# Patient Record
Sex: Male | Born: 1940 | Race: White | Hispanic: No | Marital: Married | Smoking: Former smoker
Health system: Southern US, Community
[De-identification: ages and names within clinical notes are randomized; demographics above are authoritative.]

## PROBLEM LIST (undated history)

## (undated) DIAGNOSIS — I519 Heart disease, unspecified: Secondary | ICD-10-CM

## (undated) DIAGNOSIS — Z9889 Other specified postprocedural states: Secondary | ICD-10-CM

## (undated) DIAGNOSIS — E785 Hyperlipidemia, unspecified: Secondary | ICD-10-CM

## (undated) DIAGNOSIS — I4891 Unspecified atrial fibrillation: Secondary | ICD-10-CM

## (undated) DIAGNOSIS — Z9289 Personal history of other medical treatment: Secondary | ICD-10-CM

## (undated) DIAGNOSIS — I509 Heart failure, unspecified: Secondary | ICD-10-CM

## (undated) DIAGNOSIS — I219 Acute myocardial infarction, unspecified: Secondary | ICD-10-CM

## (undated) DIAGNOSIS — K635 Polyp of colon: Secondary | ICD-10-CM

## (undated) DIAGNOSIS — Z8679 Personal history of other diseases of the circulatory system: Secondary | ICD-10-CM

## (undated) DIAGNOSIS — I2510 Atherosclerotic heart disease of native coronary artery without angina pectoris: Secondary | ICD-10-CM

## (undated) DIAGNOSIS — I10 Essential (primary) hypertension: Secondary | ICD-10-CM

## (undated) DIAGNOSIS — K639 Disease of intestine, unspecified: Secondary | ICD-10-CM

## (undated) DIAGNOSIS — I499 Cardiac arrhythmia, unspecified: Secondary | ICD-10-CM

## (undated) HISTORY — DX: Cardiac arrhythmia, unspecified: I49.9

## (undated) HISTORY — PX: COLON SURGERY: SHX602

## (undated) HISTORY — DX: Essential (primary) hypertension: I10

## (undated) HISTORY — DX: Polyp of colon: K63.5

## (undated) HISTORY — DX: Acute myocardial infarction, unspecified: I21.9

## (undated) HISTORY — DX: Other specified postprocedural states: Z98.890

## (undated) HISTORY — DX: Disease of intestine, unspecified: K63.9

## (undated) HISTORY — DX: Atherosclerotic heart disease of native coronary artery without angina pectoris: I25.10

## (undated) HISTORY — DX: Personal history of other medical treatment: Z92.89

## (undated) HISTORY — DX: Personal history of other diseases of the circulatory system: Z86.79

## (undated) HISTORY — DX: Heart failure, unspecified: I50.9

## (undated) HISTORY — PX: CARDIAC CATHETERIZATION: SHX172

## (undated) HISTORY — DX: Unspecified atrial fibrillation: I48.91

## (undated) HISTORY — DX: Hyperlipidemia, unspecified: E78.5

## (undated) HISTORY — DX: Heart disease, unspecified: I51.9

---

## 1963-12-21 DIAGNOSIS — Z8679 Personal history of other diseases of the circulatory system: Secondary | ICD-10-CM

## 1963-12-21 HISTORY — DX: Personal history of other diseases of the circulatory system: Z86.79

## 1985-12-20 DIAGNOSIS — I519 Heart disease, unspecified: Secondary | ICD-10-CM

## 1985-12-20 DIAGNOSIS — I219 Acute myocardial infarction, unspecified: Secondary | ICD-10-CM

## 1985-12-20 HISTORY — DX: Acute myocardial infarction, unspecified: I21.9

## 1985-12-20 HISTORY — DX: Heart disease, unspecified: I51.9

## 1997-12-20 DIAGNOSIS — K639 Disease of intestine, unspecified: Secondary | ICD-10-CM

## 1997-12-20 HISTORY — DX: Disease of intestine, unspecified: K63.9

## 1997-12-20 HISTORY — PX: COLON RESECTION: SHX5231

## 2005-12-20 DIAGNOSIS — I10 Essential (primary) hypertension: Secondary | ICD-10-CM

## 2005-12-20 HISTORY — DX: Essential (primary) hypertension: I10

## 2006-09-12 ENCOUNTER — Ambulatory Visit: Payer: Self-pay | Admitting: General Surgery

## 2013-03-23 ENCOUNTER — Encounter: Payer: Self-pay | Admitting: *Deleted

## 2013-03-23 DIAGNOSIS — I519 Heart disease, unspecified: Secondary | ICD-10-CM | POA: Insufficient documentation

## 2013-03-23 DIAGNOSIS — K639 Disease of intestine, unspecified: Secondary | ICD-10-CM | POA: Insufficient documentation

## 2013-04-02 ENCOUNTER — Ambulatory Visit: Payer: Self-pay | Admitting: Physical Medicine and Rehabilitation

## 2013-04-03 ENCOUNTER — Encounter: Payer: Self-pay | Admitting: General Surgery

## 2013-04-03 ENCOUNTER — Ambulatory Visit (INDEPENDENT_AMBULATORY_CARE_PROVIDER_SITE_OTHER): Payer: Medicare Other | Admitting: General Surgery

## 2013-04-03 VITALS — BP 120/70 | HR 80 | Resp 16 | Ht 68.0 in | Wt 221.0 lb

## 2013-04-03 DIAGNOSIS — Z86010 Personal history of colonic polyps: Secondary | ICD-10-CM

## 2013-04-03 MED ORDER — POLYETHYLENE GLYCOL 3350 17 GM/SCOOP PO POWD: ORAL | Status: DC

## 2013-04-03 NOTE — Patient Instructions (Addendum)
Patient to have screening colonoscopy.   Patient has been scheduled for a colonoscopy on 05-30-13 at [facility]. This patient has been asked to discontinue warfarin five days prior to procedure.

## 2013-04-03 NOTE — Progress Notes (Signed)
Patient ID: John [name], male   DOB: January 31, 1941, 72 y.o.   MRN: [id number]  Chief Complaint  Patient presents with  . Other    screening colonoscopy    HPI John [name] is a 72 y.o. male who presents today for a screening colonoscopy evaluation. Most recent colonoscopy was done October 2007 were polyps were found. Patient has a history of colon resection which was done in 2000.  The patient states no complaints at this time.  HPI  Past Medical History  Diagnosis Date  . Hypertension 2007  . Bowel trouble 1999  . Cardiac disease 1987  . History of angina 1965  . Heart attack 1987  . A-fib   . Colon polyp   . Hyperlipidemia     Past Surgical History  Procedure Laterality Date  . Colon resection  1999    Family History  Problem Relation Age of Onset  . Other Father     brain tumor    Social History History  Substance Use Topics  . Smoking status: Former Smoker -- 30 years  . Smokeless tobacco: Never Used  . Alcohol Use: 7.0 oz/week    14 drink(s) per week    Allergies  Allergen Reactions  . Procan Sr (Procainamide) Rash    Current Outpatient Prescriptions  Medication Sig Dispense Refill  . digoxin (LANOXIN) 0.125 MG tablet Take 0.125 mg by mouth daily.      Marland Kitchen doxycycline (VIBRAMYCIN) 100 MG capsule Take 100 mg by mouth daily.      . hydrochlorothiazide (HYDRODIURIL) 25 MG tablet Take 25 mg by mouth daily.      . Hydrocortisone Probutate (PANDEL) 0.1 % CREA Apply 1 application topically daily as needed.      Marland Kitchen lisinopril (PRINIVIL,ZESTRIL) 40 MG tablet Take 40 mg by mouth 2 (two) times daily.      Marland Kitchen loratadine (CLARITIN) 10 MG tablet Take 10 mg by mouth daily.      . niacin 500 MG tablet Take 1,000 mg by mouth daily.      . nitroGLYCERIN (NITROSTAT) 0.4 MG SL tablet Place 0.4 mg under the tongue every 5 (five) minutes as needed for chest pain.      . polyethylene glycol powder (GLYCOLAX/MIRALAX) powder 255 grams one bottle for colonoscopy prep  255 g  0  .  simvastatin (ZOCOR) 40 MG tablet Take 40 mg by mouth every evening.      . warfarin (COUMADIN) 1 MG tablet Take as directed up to 3 per day.      . warfarin (COUMADIN) 5 MG tablet Use as directed. Take up to 3 per day.       No current facility-administered medications for this visit.    Review of Systems Review of Systems  Constitutional: Negative.   Respiratory: Negative.   Cardiovascular: Negative.   Gastrointestinal: Negative.     Blood pressure 120/70, pulse 80, resp. rate 16, height 5\' 8"  (1.727 m), weight 221 lb (100.245 kg).  Physical Exam Physical Exam  Constitutional: He appears well-developed and well-nourished.  Cardiovascular: Normal rate, normal heart sounds and normal pulses.   No murmur heard. Pulmonary/Chest: Effort normal and breath sounds normal.    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 Colonoscopy in September 2007 showed a transverse colon tubular adenoma 8 mm in diameter. Polyps in the sigmoid and rectum were hyperplastic.  Comprehensive metabolic panel dated 02/21/2013 showed normal electrolytes, creatinine 0.9, normal liver function  studies, blood sugar 114.  CBC of the same date showed a hemoglobin of 13.3, MCV 8096, hematocrit 40, white blood cell count 6500 with normal differential, platelet count 184,000. Hemoglobin A1c 5.7.  Assessment    Past history of colonic polyps in 1998 and 2007. Atrial fibrillation requiring oral anticoagulation.    Plan    Colonoscopy plans were reviewed with the patient. She will be necessary for him to discontinue his Coumadin 5 days prior to the procedure. He will continue all his other cardiac medications. If he is presently not taking aspirin, he will be asked to make use of a 81 mg aspirin while he is not taking his Coumadin (aspirin was not listed as an active medication).    Patient has been scheduled for a colonoscopy on 05-30-13 at [facility]. This  patient has been asked to discontinue warfarin five days prior to procedure.    John [name] 04/04/2013, 3:24 PM

## 2013-04-04 DIAGNOSIS — Z860100 Personal history of colon polyps, unspecified: Secondary | ICD-10-CM | POA: Insufficient documentation

## 2013-05-18 ENCOUNTER — Telehealth: Payer: Self-pay | Admitting: *Deleted

## 2013-05-18 NOTE — Telephone Encounter (Signed)
Message copied by Nicholes Mango on Fri May 18, 2013 10:06 AM ------      Message from: Meacham, Merrily Pew      Created: Wed Apr 04, 2013  3:35 PM       When you contact the patient the week prior to his colonoscopy in June 2014, ask him to make use of a pediatric aspirin tablet when he stopped his Coumadin in preparation for the procedure. Thank you ------

## 2013-05-18 NOTE — Telephone Encounter (Signed)
Patient was notified as instructed. This patient also reports all medications are the same except he has started taking gabapentin. Medication will be added to list. He reports he has yet to pre-register but was instructed to do so no later than Friday, 05-25-13. Patient will call the office if he has further questions. We will proceed with colonoscopy that is scheduled for 05-30-13.

## 2013-05-28 ENCOUNTER — Other Ambulatory Visit: Payer: Self-pay | Admitting: General Surgery

## 2013-05-28 DIAGNOSIS — Z86010 Personal history of colonic polyps: Secondary | ICD-10-CM

## 2013-05-30 ENCOUNTER — Ambulatory Visit: Payer: Self-pay | Admitting: General Surgery

## 2013-05-30 ENCOUNTER — Encounter: Payer: Self-pay | Admitting: General Surgery

## 2013-05-30 DIAGNOSIS — D128 Benign neoplasm of rectum: Secondary | ICD-10-CM

## 2013-05-30 DIAGNOSIS — K635 Polyp of colon: Secondary | ICD-10-CM

## 2013-05-30 DIAGNOSIS — D129 Benign neoplasm of anus and anal canal: Secondary | ICD-10-CM

## 2013-05-30 HISTORY — PX: COLONOSCOPY: SHX174

## 2013-05-30 HISTORY — DX: Polyp of colon: K63.5

## 2013-06-01 ENCOUNTER — Encounter: Payer: Self-pay | Admitting: General Surgery

## 2013-06-01 NOTE — Progress Notes (Signed)
Quick Note:  Polyps removed were benign. One was a small tubular adenoma, remaining hyperplastic. F/U exam in five years.    Cc:: PCP ______

## 2013-06-04 ENCOUNTER — Telehealth: Payer: Self-pay | Admitting: *Deleted

## 2013-06-04 NOTE — Telephone Encounter (Signed)
Patient was notified and will  return in 5 years for colonoscopy.

## 2013-06-05 ENCOUNTER — Encounter: Payer: Self-pay | Admitting: General Surgery

## 2017-03-08 ENCOUNTER — Ambulatory Visit: Payer: Self-pay | Admitting: Dietician

## 2017-03-29 ENCOUNTER — Encounter: Payer: Medicare Other | Attending: Family Medicine | Admitting: Dietician

## 2017-03-29 ENCOUNTER — Encounter: Payer: Self-pay | Admitting: Dietician

## 2017-03-29 VITALS — Ht 68.0 in | Wt 232.3 lb

## 2017-03-29 DIAGNOSIS — E162 Hypoglycemia, unspecified: Secondary | ICD-10-CM | POA: Insufficient documentation

## 2017-03-29 DIAGNOSIS — E119 Type 2 diabetes mellitus without complications: Secondary | ICD-10-CM | POA: Diagnosis not present

## 2017-03-29 DIAGNOSIS — IMO0001 Reserved for inherently not codable concepts without codable children: Secondary | ICD-10-CM

## 2017-03-29 DIAGNOSIS — R739 Hyperglycemia, unspecified: Secondary | ICD-10-CM

## 2017-03-29 NOTE — Patient Instructions (Signed)
   Pay careful attention to portions of starchy foods. Keep to 1/4 of your plate, or fist-size or less.   Add larger portions of low-carb veggies (low Vitamin K) and/or fruits to meals to feel full.

## 2017-03-29 NOTE — Progress Notes (Signed)
Medical Nutrition Therapy: Visit start time: 0900  end time: 1000  Assessment:  Diagnosis: diabetes/ hyperglycemia Past medical history: MI at age 76; HTN, sciatica Psychosocial issues/ stress concerns: none Preferred learning method:  . Visual  Current weight: 232.3lbs with shoes  Height: 5'8" Medications, supplements: reconciled list in medical record; patient is on Warfarin therapy  Progress and evaluation: Patient reports increase in BGs in the past 2 years since he has been unable to do much exercise due to sciatica. Does check BGs at home sporadically, every 1-2 weeks, fasting, with results typically in 120s. He and his wife have been incorporating heart healthy eating habits for the past 30 years since his MI.    Physical activity: none due to back pain  Dietary Intake:  Usual eating pattern includes 3 meals and 1-2 snacks per day. Dining out frequency: 10 meals per week.  Breakfast: 8am oatmeal and cottage cheese with fruit, decaf coffee 1c.  Snack: none Lunch: often out-- chicken sandwich or cold cut sandwich, occasionally with chips, unsweet tea Snack: peanuts Supper: wife cooks-- has diabetic cookbook or heart assoc. Sometimes uses [place] recipes-- lasagna with lowfat cheese and whole wheat pasta (freezes portions); spaghetti-tries to control portions. Adds salad or green beans or peas, low vitamin K vegetables.  Snack: glass wine Beverages: water, coffee, unsweet tea, 1 wine   Nutrition Care Education: Topics covered: diabetes, weight control Basic nutrition: basic food groups, appropriate nutrient balance, appropriate meal and snack schedule, general nutrition guidelines    Weight control: benefits of weight control, behavioral changes for weight loss including portion control; provided guidance for appropriate portions of starches and meats and basic meal planning for 1600-1700kcal intake. Used plate method and food models to illustrate portions.  Diabetes: appropriate  meal and snack schedule, appropriate carb intake and balance, healthy carb choices  Nutritional Diagnosis:  [place]-2.2 Altered nutrition-related laboratory As related to hyperglycemia.  As evidenced by HbA1C 6.5%. [place]-3.3 Overweight/obesity As related to inactivity, excess calories.  As evidenced by BMI 35.3.  Intervention: Instruction as noted above.   Set goals with direction from patient.    Commended patient for efforts at heart healthy diet: he is making low fat choices, several whole grain choices, and is including vegetables regularly.      Education Materials given:  . General diet guidelines for Diabetes . Plate Planner . Recipes-- Diabetes Friendly recipes . Sample menus: Quick and Healthy meals . Goals/ instructions  Learner/ who was taught:  . Patient   Level of understanding: Marland Kitchen Verbalizes/ demonstrates competency  Demonstrated degree of understanding via:   Teach back Learning barriers: . None  Willingness to learn/ readiness for change: . Eager, change in progress  Monitoring and Evaluation:  Dietary intake, exercise, BG control, and body weight      follow up: 04/29/17

## 2017-04-29 ENCOUNTER — Ambulatory Visit: Payer: Medicare Other | Admitting: Dietician

## 2017-05-20 ENCOUNTER — Encounter: Payer: Medicare Other | Attending: Family Medicine | Admitting: Dietician

## 2017-05-20 VITALS — Ht 68.0 in | Wt 233.5 lb

## 2017-05-20 DIAGNOSIS — R739 Hyperglycemia, unspecified: Secondary | ICD-10-CM

## 2017-05-20 DIAGNOSIS — E162 Hypoglycemia, unspecified: Secondary | ICD-10-CM | POA: Diagnosis present

## 2017-05-20 DIAGNOSIS — E119 Type 2 diabetes mellitus without complications: Secondary | ICD-10-CM | POA: Diagnosis present

## 2017-05-20 DIAGNOSIS — IMO0001 Reserved for inherently not codable concepts without codable children: Secondary | ICD-10-CM

## 2017-05-20 NOTE — Progress Notes (Signed)
Medical Nutrition Therapy: Visit start time: 0900  end time: 0920 Assessment:  Diagnosis: diabetes, obesity Medical history changes: no changes, will be receiving cortisone injections for back pain starting 06/09/17 Psychosocial issues/ stress concerns: none  Current weight: 233.1lbs Height: 5'8" Medications, supplement changes: slight increase in warfarin, but patient cannot recall exact days.   Progress and evaluation: Weight has essentially been stable, within 1lb. Since 03/29/17. Back pain has continued, so patient has been unable to increase exercise. He reports eating less at lunch most days.    Physical activity: low due to back pain  Dietary Intake:  Usual eating pattern includes 3 meals and 1-2 snacks per day. Dining out frequency: 10 meals per week.  Breakfast: oatmeal and fruit with cottage cheese, coffee Snack: none Lunch: usually out, chicken or cold cut sandwich; sometimes at [employer], now eating 1/2 plate Snack: peanuts occasionally Supper: recipes from heart assoc or diabetes cookbook; low Vitamin K vegetables Snack: glass wine Beverages: water-- increased from previous visit, coffee, unsweet tea  Nutrition Care Education: Topics covered: diabetes, weight management    Weight control: potential help in keeping food diary and/or measuring food portions, reviewed role of exercise. Diabetes: effects of pain and cortisone medication on blood sugars. Advised additional testing after receiving injection, and notifying MD if BGs increase significantly.   Nutritional Diagnosis:  [place]-2.2 Altered nutrition-related laboratory As related to hyperglycemia.  As evidenced by HbA1C 6.5%. [place]-3.3 Overweight/obesity As related to inactivity, excess calories.  As evidenced by BMI 35.3, patient report.  Intervention: Discussion as noted above.   Patient's goal is to continue with his current eating pattern, and will increase activity once he feels pain relief from cortisone  injections.     He declined further follow-up at this time.   Education Materials given:  Marland Kitchen Goals/ instructions  Learner/ who was taught:  . Patient   Level of understanding: Marland Kitchen Verbalizes/ demonstrates competency  Demonstrated degree of understanding via:   Teach back Learning barriers: . None  Willingness to learn/ readiness for change: . Eager, change in progress  Monitoring and Evaluation:  Dietary intake, exercise, BG control, and body weight      follow up: prn

## 2017-05-20 NOTE — Patient Instructions (Signed)
   Continue with current healthy eating pattern.   Test blood sugar daily for several days after cortisone injections. If blood sugars increase into 200s, let your doctor know.   Can consider keeping a food diary, and/or measuring some food portions to build greater awareness of actual food intake.

## 2018-03-31 ENCOUNTER — Encounter: Payer: Self-pay | Admitting: *Deleted

## 2018-06-06 ENCOUNTER — Ambulatory Visit (INDEPENDENT_AMBULATORY_CARE_PROVIDER_SITE_OTHER): Payer: Medicare Other | Admitting: General Surgery

## 2018-06-06 ENCOUNTER — Encounter: Payer: Self-pay | Admitting: General Surgery

## 2018-06-06 VITALS — BP 148/72 | HR 59 | Resp 14 | Ht 68.0 in | Wt 230.0 lb

## 2018-06-06 DIAGNOSIS — Z86010 Personal history of colonic polyps: Secondary | ICD-10-CM

## 2018-06-06 NOTE — Progress Notes (Signed)
Patient ID: John [name], male   DOB: Oct 18, 1941, 77 y.o.   MRN: [id number]  Chief Complaint  Patient presents with  . Colon Polyps    HPI John [name] is a 77 y.o. male.  Who presents for a colonoscopy discussion. The last colonoscopy was completed on 05-30-13. Denies any gastrointestinal issues. Bowels move regular and no bleeding noted.  Coumadin dose is 6 mg two days a week and the other days is 7 mg. He works part time as an Chief Financial Officer HPI  Past Medical History:  Diagnosis Date  . A-fib ([place])   . Bowel trouble 1999  . Cardiac disease 1987  . 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 Heart attack ([place]) 1987  . History of angina 1965  . History of cardioversion   . Hyperlipidemia   . Hypertension 2007    Past Surgical History:  Procedure Laterality Date  . COLON RESECTION  1999  . COLONOSCOPY  05/30/2013   Dr Bary Castilla    Family History  Problem Relation Age of Onset  . Other Father        brain tumor    Social History Social History   Tobacco Use  . Smoking status: Former Smoker    Years: 30.00  . Smokeless tobacco: Never Used  Substance Use Topics  . Alcohol use: Yes    Alcohol/week: 4.2 oz    Types: 7 Glasses of wine per week    Comment: 1 glass red wine daily  . Drug use: No    Allergies  Allergen Reactions  . Procan Sr [Procainamide] Rash    Current Outpatient Medications  Medication Sig Dispense Refill  . amLODipine (NORVASC) 5 MG tablet Take 5 mg by mouth daily.  3  . atorvastatin (LIPITOR) 80 MG tablet TAKE 1 TABLET (80 MG TOTAL) BY MOUTH ONCE DAILY.  3  . Cetirizine HCl (ZYRTEC ALLERGY PO) Take by mouth.    . digoxin (LANOXIN) 0.125 MG tablet Take 0.125 mg by mouth daily.    Marland Kitchen ezetimibe (ZETIA) 10 MG tablet Take by mouth.    Marland Kitchen lisinopril (PRINIVIL,ZESTRIL) 40 MG tablet Take 40 mg by mouth 2 (two) times daily.    Marland Kitchen LYRICA 150 MG capsule     . nitroGLYCERIN (NITROSTAT) 0.4 MG SL tablet Place 0.4 mg under the tongue every 5 (five)  minutes as needed for chest pain.    Marland Kitchen warfarin (COUMADIN) 1 MG tablet Take as directed up to 3 per day.    . warfarin (COUMADIN) 5 MG tablet Use as directed. Take up to 3 per day.     No current facility-administered medications for this visit.     Review of Systems Review of Systems  Constitutional: Negative.   Respiratory: Negative.   Cardiovascular: Negative.   Gastrointestinal: Negative for constipation and diarrhea.    Blood pressure (!) 148/72, pulse (!) 59, resp. rate 14, height 5\' 8"  (1.727 m), weight 230 lb (104.3 kg), SpO2 99 %.  Physical Exam Physical Exam  Constitutional: He is oriented to person, place, and time. He appears well-developed and well-nourished.  HENT:  Mouth/Throat: No oropharyngeal exudate.  Eyes: Conjunctivae are normal. No scleral icterus.  Neck: Neck supple.  Cardiovascular: Normal rate and normal heart sounds. An irregular rhythm present.  Pulmonary/Chest: Effort normal and breath sounds normal.  Neurological: He is alert and oriented to person, place, and time.  Skin: Skin is warm and dry.  Psychiatric: His behavior is normal.    Data  Reviewed May 30, 2013 colonoscopy result: Past history colonic polyp, Diagnosis:  Part A: TRANSVERSE COLON MUCOSAL COLD BIOPSY:  - COLONIC MUCOSA WITH FOCAL HEMORRHAGE.  - NEGATIVE FOR COLITIS, DYSPLASIA AND MALIGNANCY.  .  Part B: DESCENDING COLON POLYP X2 COLD BIOPSY:  HYPERPLASTIC POLYP (1).  - TUBULAR ADENOMA (1).  - NEGATIVE FOR HIGH GRADE DYSPLASIA AND MALIGNANCY.  .  Part C: RECTAL POLYPS X2 COLD BIOPSY:  - HYPERPLASTIC POLYP (3 FRAGMENTS).  - NEGATIVE FOR DYSPLASIA AND MALIGNANCY.  .  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   Colonoscopy in September 2007 showed a transverse colon tubular adenoma 8 mm in diameter. Polyps in the sigmoid and rectum were hyperplastic.   Pro time INR of May 12, 2018: 3.0. Comprehensive metabolic panel in  February 15, 2018: Normal renal function, creatinine 0.8. CBC of February 15, 2018: Hemoglobin 13.6 with an MCV of 90.  White blood cell count 7200.  Assessment    Excellent physical health.  Past history colonic polyp.    Plan   Indications for repeat colonoscopy reviewed.  Stop coumadin 5 days prior to colonoscopy Colonoscopy with possible biopsy/polypectomy prn: Information regarding the procedure, including its potential risks and complications (including but not limited to perforation of the bowel, which may require emergency surgery to repair, and bleeding) was verbally given to the patient. Educational information regarding lower intestinal endoscopy was given to the patient. Written instructions for how to complete the bowel prep using Miralax were provided. The importance of drinking ample fluids to avoid dehydration as a result of the prep emphasized.      HPI, Physical Exam, Assessment and Plan have been scribed under the direction and in the presence of Robert Bellow, MD. Karie Fetch, RN  I have completed the exam and reviewed the above documentation for accuracy and completeness.  I agree with the above.  Haematologist has been used and any errors in dictation or transcription are unintentional.  Hervey Ard, M.D., F.A.C.S.  John [name] 06/06/2018, 8:27 PM

## 2018-06-06 NOTE — Patient Instructions (Addendum)
Stop coumadin 5 days prior to colonoscopy    Colonoscopy, Adult A colonoscopy is an exam to look at the entire large intestine. During the exam, a lubricated, bendable tube is inserted into the anus and then passed into the rectum, colon, and other parts of the large intestine. A colonoscopy is often done as a part of normal colorectal screening or in response to certain symptoms, such as anemia, persistent diarrhea, abdominal pain, and blood in the stool. The exam can help screen for and diagnose medical problems, including:  Tumors.  Polyps.  Inflammation.  Areas of bleeding.  Tell a health care provider about:  Any allergies you have.  All medicines you are taking, including vitamins, herbs, eye drops, creams, and over-the-counter medicines.  Any problems you or family members have had with anesthetic medicines.  Any blood disorders you have.  Any surgeries you have had.  Any medical conditions you have.  Any problems you have had passing stool. What are the risks? Generally, this is a safe procedure. However, problems may occur, including:  Bleeding.  A tear in the intestine.  A reaction to medicines given during the exam.  Infection (rare).  What happens before the procedure? Eating and drinking restrictions Follow instructions from your health care provider about eating and drinking, which may include:  A few days before the procedure - follow a low-fiber diet. Avoid nuts, seeds, dried fruit, raw fruits, and vegetables.  1-3 days before the procedure - follow a clear liquid diet. Drink only clear liquids, such as clear broth or bouillon, black coffee or tea, clear juice, clear soft drinks or sports drinks, gelatin dessert, and popsicles. Avoid any liquids that contain red or purple dye.  On the day of the procedure - do not eat or drink anything during the 2 hours before the procedure, or within the time period that your health care provider  recommends.  Bowel prep If you were prescribed an oral bowel prep to clean out your colon:  Take it as told by your health care provider. Starting the day before your procedure, you will need to drink a large amount of medicated liquid. The liquid will cause you to have multiple loose stools until your stool is almost clear or light green.  If your skin or anus gets irritated from diarrhea, you may use these to relieve the irritation: ? Medicated wipes, such as adult wet wipes with aloe and vitamin E. ? A skin soothing-product like petroleum jelly.  If you vomit while drinking the bowel prep, take a break for up to 60 minutes and then begin the bowel prep again. If vomiting continues and you cannot take the bowel prep without vomiting, call your health care provider.  General instructions  Ask your health care provider about changing or stopping your regular medicines. This is especially important if you are taking diabetes medicines or blood thinners.  Plan to have someone take you home from the hospital or clinic. What happens during the procedure?  An IV tube may be inserted into one of your veins.  You will be given medicine to help you relax (sedative).  To reduce your risk of infection: ? Your health care team will wash or sanitize their hands. ? Your anal area will be washed with soap.  You will be asked to lie on your side with your knees bent.  Your health care provider will lubricate a long, thin, flexible tube. The tube will have a camera and a light on  the end.  The tube will be inserted into your anus.  The tube will be gently eased through your rectum and colon.  Air will be delivered into your colon to keep it open. You may feel some pressure or cramping.  The camera will be used to take images during the procedure.  A small tissue sample may be removed from your body to be examined under a microscope (biopsy). If any potential problems are found, the tissue  will be sent to a lab for testing.  If small polyps are found, your health care provider may remove them and have them checked for cancer cells.  The tube that was inserted into your anus will be slowly removed. The procedure may vary among health care providers and hospitals. What happens after the procedure?  Your blood pressure, heart rate, breathing rate, and blood oxygen level will be monitored until the medicines you were given have worn off.  Do not drive for 24 hours after the exam.  You may have a small amount of blood in your stool.  You may pass gas and have mild abdominal cramping or bloating due to the air that was used to inflate your colon during the exam.  It is up to you to get the results of your procedure. Ask your health care provider, or the department performing the procedure, when your results will be ready. This information is not intended to replace advice given to you by your health care provider. Make sure you discuss any questions you have with your health care provider. Document Released: 12/03/2000 Document Revised: 10/06/2016 Document Reviewed: 02/17/2016 Elsevier Interactive Patient Education  2018 [employer].

## 2018-06-08 ENCOUNTER — Telehealth: Payer: Self-pay

## 2018-06-08 MED ORDER — POLYETHYLENE GLYCOL 3350 17 GM/SCOOP PO POWD: 1.0000 | Freq: Once | ORAL | 0 refills | Status: AC

## 2018-06-08 NOTE — Telephone Encounter (Signed)
Patient called to schedule his Colonoscopy. The patient is scheduled for a Colonoscopy at [facility] on 08/16/18. They are aware to call the day before to get their arrival time. He will hold his Coumadin for 5 days prior. Miralax prescription has been sent into the patient's pharmacy. The patient is aware of date and instructions.

## 2018-06-09 ENCOUNTER — Other Ambulatory Visit: Payer: Self-pay

## 2018-06-09 MED ORDER — POLYETHYLENE GLYCOL 3350 17 GM/SCOOP PO POWD: 1.0000 | Freq: Once | ORAL | 0 refills | Status: AC

## 2018-08-16 ENCOUNTER — Ambulatory Visit
Admission: RE | Admit: 2018-08-16 | Discharge: 2018-08-16 | Disposition: A | Discharge status: Home / Self Care | Payer: Medicare Other | Source: Ambulatory Visit | Attending: General Surgery | Admitting: General Surgery

## 2018-08-16 ENCOUNTER — Encounter: Payer: Self-pay | Admitting: *Deleted

## 2018-08-16 ENCOUNTER — Ambulatory Visit: Payer: Medicare Other | Admitting: Anesthesiology

## 2018-08-16 ENCOUNTER — Other Ambulatory Visit: Payer: Self-pay

## 2018-08-16 ENCOUNTER — Encounter
Admission: RE | Disposition: A | Discharge status: Home / Self Care | Payer: Self-pay | Source: Ambulatory Visit | Attending: General Surgery

## 2018-08-16 DIAGNOSIS — Z980 Intestinal bypass and anastomosis status: Secondary | ICD-10-CM | POA: Diagnosis not present

## 2018-08-16 DIAGNOSIS — D128 Benign neoplasm of rectum: Secondary | ICD-10-CM | POA: Diagnosis not present

## 2018-08-16 DIAGNOSIS — I4891 Unspecified atrial fibrillation: Secondary | ICD-10-CM | POA: Diagnosis not present

## 2018-08-16 DIAGNOSIS — K621 Rectal polyp: Secondary | ICD-10-CM | POA: Diagnosis not present

## 2018-08-16 DIAGNOSIS — E785 Hyperlipidemia, unspecified: Secondary | ICD-10-CM | POA: Insufficient documentation

## 2018-08-16 DIAGNOSIS — I10 Essential (primary) hypertension: Secondary | ICD-10-CM | POA: Diagnosis not present

## 2018-08-16 DIAGNOSIS — Z888 Allergy status to other drugs, medicaments and biological substances status: Secondary | ICD-10-CM | POA: Insufficient documentation

## 2018-08-16 DIAGNOSIS — Z1211 Encounter for screening for malignant neoplasm of colon: Secondary | ICD-10-CM | POA: Diagnosis not present

## 2018-08-16 DIAGNOSIS — Z87891 Personal history of nicotine dependence: Secondary | ICD-10-CM | POA: Insufficient documentation

## 2018-08-16 DIAGNOSIS — Z79899 Other long term (current) drug therapy: Secondary | ICD-10-CM | POA: Diagnosis not present

## 2018-08-16 DIAGNOSIS — Z86010 Personal history of colonic polyps: Secondary | ICD-10-CM

## 2018-08-16 DIAGNOSIS — Z9049 Acquired absence of other specified parts of digestive tract: Secondary | ICD-10-CM | POA: Diagnosis not present

## 2018-08-16 DIAGNOSIS — I252 Old myocardial infarction: Secondary | ICD-10-CM | POA: Diagnosis not present

## 2018-08-16 DIAGNOSIS — D123 Benign neoplasm of transverse colon: Secondary | ICD-10-CM | POA: Insufficient documentation

## 2018-08-16 DIAGNOSIS — D124 Benign neoplasm of descending colon: Secondary | ICD-10-CM | POA: Insufficient documentation

## 2018-08-16 DIAGNOSIS — Z7901 Long term (current) use of anticoagulants: Secondary | ICD-10-CM | POA: Insufficient documentation

## 2018-08-16 HISTORY — PX: COLONOSCOPY WITH PROPOFOL: SHX5780

## 2018-08-16 SURGERY — COLONOSCOPY WITH PROPOFOL
Anesthesia: General

## 2018-08-16 MED ORDER — FENTANYL CITRATE (PF) 100 MCG/2ML IJ SOLN
INTRAMUSCULAR | Status: AC
  Filled 2018-08-16: qty 2

## 2018-08-16 MED ORDER — PROPOFOL 500 MG/50ML IV EMUL
INTRAVENOUS | Status: AC
  Filled 2018-08-16: qty 50

## 2018-08-16 MED ORDER — SODIUM CHLORIDE 0.9 % IV SOLN
INTRAVENOUS | Status: DC
  Administered 2018-08-16 (×2): via INTRAVENOUS

## 2018-08-16 MED ORDER — EPHEDRINE SULFATE 50 MG/ML IJ SOLN
INTRAMUSCULAR | Status: AC
  Filled 2018-08-16: qty 1

## 2018-08-16 MED ORDER — PROPOFOL 10 MG/ML IV BOLUS
INTRAVENOUS | Status: AC
  Filled 2018-08-16: qty 20

## 2018-08-16 MED ORDER — LIDOCAINE 2% (20 MG/ML) 5 ML SYRINGE
INTRAMUSCULAR | Status: DC | PRN
  Administered 2018-08-16: 30 mg via INTRAVENOUS

## 2018-08-16 MED ORDER — FENTANYL CITRATE (PF) 100 MCG/2ML IJ SOLN
INTRAMUSCULAR | Status: DC | PRN
  Administered 2018-08-16: 50 ug via INTRAVENOUS

## 2018-08-16 MED ORDER — PROPOFOL 10 MG/ML IV BOLUS
INTRAVENOUS | Status: DC | PRN
  Administered 2018-08-16: 80 mg via INTRAVENOUS

## 2018-08-16 MED ORDER — PROPOFOL 10 MG/ML IV BOLUS: INTRAVENOUS | Status: DC | PRN

## 2018-08-16 MED ORDER — LIDOCAINE HCL (PF) 2 % IJ SOLN
INTRAMUSCULAR | Status: AC
  Filled 2018-08-16: qty 10

## 2018-08-16 MED ORDER — PHENYLEPHRINE HCL 10 MG/ML IJ SOLN
INTRAMUSCULAR | Status: DC | PRN
  Administered 2018-08-16 (×2): 100 ug via INTRAVENOUS

## 2018-08-16 MED ORDER — PROPOFOL 500 MG/50ML IV EMUL
INTRAVENOUS | Status: DC | PRN
  Administered 2018-08-16: 160 ug/kg/min via INTRAVENOUS

## 2018-08-16 NOTE — H&P (Signed)
John [name] [id number] 1941-03-01     HPI: Healthy 77 y/o male with past history of colonic polyps. For repeat colonoscopy. Tolerated prep well.  Stopped coumadin 5 days ago as requested.   Medications Prior to Admission  Medication Sig Dispense Refill Last Dose  . amLODipine (NORVASC) 5 MG tablet Take 5 mg by mouth daily.  3 08/15/2018 at 2100  . atorvastatin (LIPITOR) 80 MG tablet TAKE 1 TABLET (80 MG TOTAL) BY MOUTH ONCE DAILY.  3 Past Week at Unknown time  . Cetirizine HCl (ZYRTEC ALLERGY PO) Take by mouth.   Past Week at Unknown time  . digoxin (LANOXIN) 0.125 MG tablet Take 0.125 mg by mouth daily.   08/15/2018 at 2100  . lisinopril (PRINIVIL,ZESTRIL) 40 MG tablet Take 40 mg by mouth 2 (two) times daily.   08/15/2018 at 2100  . LYRICA 150 MG capsule    Past Week at Unknown time  . nitroGLYCERIN (NITROSTAT) 0.4 MG SL tablet Place 0.4 mg under the tongue every 5 (five) minutes as needed for chest pain.   Past Month at Unknown time  . warfarin (COUMADIN) 1 MG tablet Take as directed up to 3 per day.   Past Week at Unknown time  . warfarin (COUMADIN) 5 MG tablet Use as directed. Take up to 3 per day.   08/12/2018 at Unknown time  . ezetimibe (ZETIA) 10 MG tablet Take by mouth.   Taking   Allergies  Allergen Reactions  . Procan Sr [Procainamide] Rash   Past Medical History:  Diagnosis Date  . A-fib ([place])   . Bowel trouble 1999  . Cardiac disease 1987  . 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 Heart attack ([place]) 1987  . History of angina 1965  . History of cardioversion   . Hyperlipidemia   . Hypertension 2007   Past Surgical History:  Procedure Laterality Date  . CARDIAC CATHETERIZATION    . COLON RESECTION  1999  . COLON SURGERY    . COLONOSCOPY  05/30/2013   Dr Bary Castilla   Social History   Socioeconomic History  . Marital status: Married    Spouse name: Not on file  . Number of children: Not on file  . Years of education: Not on file  . Highest education  level: Not on file  Occupational History  . Not on file  Social Needs  . Financial resource strain: Not on file  . Food insecurity:    Worry: Not on file    Inability: Not on file  . Transportation needs:    Medical: Not on file    Non-medical: Not on file  Tobacco Use  . Smoking status: Former Smoker    Years: 30.00  . Smokeless tobacco: Never Used  Substance and Sexual Activity  . Alcohol use: Not Currently    Alcohol/week: 7.0 standard drinks    Types: 7 Glasses of wine per week    Comment: 1 glass red wine daily  . Drug use: No  . Sexual activity: Not on file  Lifestyle  . Physical activity:    Days per week: Not on file    Minutes per session: Not on file  . Stress: Not on file  Relationships  . Social connections:    Talks on phone: Not on file    Gets together: Not on file    Attends religious service: Not on file    Active member of club or organization: Not on file  Attends meetings of clubs or organizations: Not on file    Relationship status: Not on file  . Intimate partner violence:    Fear of current or ex partner: Not on file    Emotionally abused: Not on file    Physically abused: Not on file    Forced sexual activity: Not on file  Other Topics Concern  . Not on file  Social History Narrative  . Not on file   Social History   Social History Narrative  . Not on file     ROS: Negative.     PE: HEENT: Negative. Lungs: Clear. Cardio: Basically RR with occasional premature beat.  Assessment/Plan:  Proceed with planned endoscopy.  [facility] 08/16/2018

## 2018-08-16 NOTE — Anesthesia Post-op Follow-up Note (Signed)
Anesthesia QCDR form completed.        

## 2018-08-16 NOTE — Op Note (Signed)
[facility] Patient Name: John [name] Procedure Date: 08/16/2018 7:25 AM MRN: [id number] Account #: [id number] Date of Birth: 02/23/1941 Admit Type: Outpatient Age: 77 Room: [facility] ENDO ROOM 1 Gender: Male Note Status: Finalized Procedure:            Colonoscopy Indications:          High risk colon cancer surveillance: Personal history                        of colonic polyps Providers:            Robert Bellow, MD Referring MD:         Gayland Curry MD, MD (Referring MD) Medicines:            Monitored Anesthesia Care Complications:        No immediate complications. Procedure:            Pre-Anesthesia Assessment:                       - Prior to the procedure, a History and Physical was                        performed, and patient medications, allergies and                        sensitivities were reviewed. The patient's tolerance of                        previous anesthesia was reviewed.                       - The risks and benefits of the procedure and the                        sedation options and risks were discussed with the                        patient. All questions were answered and informed                        consent was obtained.                       - The risks and benefits of the procedure and the                        sedation options and risks were discussed with the                        patient. All questions were answered and informed                        consent was obtained.                       After obtaining informed consent, the colonoscope was                        passed under direct vision. Throughout the procedure,  the patient's blood pressure, pulse, and oxygen                        saturations were monitored continuously. The                        Colonoscope was introduced through the anus and                        advanced to the the ileocolonic anastomosis. The                         colonoscopy was performed without difficulty. The                        patient tolerated the procedure well. The quality of                        the bowel preparation was excellent. Findings:      12 sessile polyps were found in the rectum, descending colon, mid       descending colon, transverse colon, mid transverse colon and distal       transverse colon. The polyps were 6 to 10 mm in size. These polyps were       removed with a hot snare. Resection and retrieval were complete.      The retroflexed view of the distal rectum and anal verge was normal and       showed no anal or rectal abnormalities. Impression:           - 12 6 to 10 mm polyps in the rectum, in the descending                        colon, in the mid descending colon, in the transverse                        colon, in the mid transverse colon and in the distal                        transverse colon, removed with a hot snare. Resected                        and retrieved.                       - The distal rectum and anal verge are normal on                        retroflexion view. Recommendation:       - Telephone endoscopist for pathology results in 1 week. Procedure Code(s):    --- Professional ---                       [id number], Colonoscopy, flexible; with removal of tumor(s),                        polyp(s), or other lesion(s) by snare technique Diagnosis Code(s):    --- Professional ---                       D12.3, Benign neoplasm  of transverse colon (hepatic                        flexure or splenic flexure)                       D12.4, Benign neoplasm of descending colon                       K62.1, Rectal polyp                       Z86.010, Personal history of colonic polyps CPT copyright 2017 American Medical Association. All rights reserved. The codes documented in this report are preliminary and upon coder review may  be revised to meet current compliance requirements. Robert Bellow, MD 08/16/2018 8:19:45 AM This report has been signed electronically. Number of Addenda: 0 Note Initiated On: 08/16/2018 7:25 AM Scope Withdrawal Time: 0 hours 31 minutes 11 seconds  Total Procedure Duration: 0 hours 38 minutes 52 seconds       [facility]

## 2018-08-16 NOTE — Transfer of Care (Signed)
Immediate Anesthesia Transfer of Care Note  Patient: John [name]  Procedure(s) Performed: COLONOSCOPY WITH PROPOFOL (N/A )  Patient Location: PACU and Endoscopy Unit  Anesthesia Type:General  Level of Consciousness: sedated  Airway & Oxygen Therapy: Patient Spontanous Breathing and Patient connected to nasal cannula oxygen  Post-op Assessment: Report given to RN and Post -op Vital signs reviewed and stable  Post vital signs: Reviewed and stable  Last Vitals:  Vitals Value Taken Time  BP 139/116 08/16/2018  8:24 AM  Temp 36.1 C 08/16/2018  8:22 AM  Pulse 69 08/16/2018  8:24 AM  Resp 14 08/16/2018  8:24 AM  SpO2 96 % 08/16/2018  8:24 AM  Vitals shown include unvalidated device data.  Last Pain:  Vitals:   08/16/18 0822  TempSrc: Tympanic  PainSc: 0-No pain         Complications: No apparent anesthesia complications

## 2018-08-17 ENCOUNTER — Encounter: Payer: Self-pay | Admitting: General Surgery

## 2018-08-17 NOTE — Anesthesia Postprocedure Evaluation (Signed)
Anesthesia Post Note  Patient: John [name]  Procedure(s) Performed: COLONOSCOPY WITH PROPOFOL (N/A )  Patient location during evaluation: PACU Anesthesia Type: General Level of consciousness: awake and alert Pain management: pain level controlled Vital Signs Assessment: post-procedure vital signs reviewed and stable Respiratory status: spontaneous breathing, nonlabored ventilation, respiratory function stable and patient connected to nasal cannula oxygen Cardiovascular status: blood pressure returned to baseline and stable Postop Assessment: no apparent nausea or vomiting Anesthetic complications: no     Last Vitals:  Vitals:   08/16/18 0852 08/16/18 0912  BP: 138/78   Pulse:    Resp:    Temp:    SpO2:  98%    Last Pain:  Vitals:   08/16/18 0912  TempSrc:   PainSc: 0-No pain                 Molli Barrows

## 2018-08-17 NOTE — Anesthesia Preprocedure Evaluation (Signed)
Anesthesia Evaluation  Patient identified by MRN, date of birth, ID band Patient awake    Reviewed: Allergy & Precautions, H&P , NPO status , Patient's Chart, lab work & pertinent test results, reviewed documented beta blocker date and time   Airway Mallampati: II   Neck ROM: full    Dental  (+) Poor Dentition   Pulmonary neg pulmonary ROS, former smoker,    Pulmonary exam normal        Cardiovascular Exercise Tolerance: Poor hypertension, + Past MI  negative cardio ROS Normal cardiovascular exam Rhythm:regular Rate:Normal     Neuro/Psych negative neurological ROS  negative psych ROS   GI/Hepatic negative GI ROS, Neg liver ROS,   Endo/Other  negative endocrine ROS  Renal/GU negative Renal ROS  negative genitourinary   Musculoskeletal   Abdominal   Peds  Hematology negative hematology ROS (+)   Anesthesia Other Findings Past Medical History: No date: A-fib [facility]) 1999: Bowel trouble 1987: Cardiac disease 05/30/2013: Colon polyp     Comment:  TUBULAR ADENOMA  1987: Heart attack ([place]) 1965: History of angina No date: History of cardioversion No date: Hyperlipidemia 2007: Hypertension Past Surgical History: No date: CARDIAC CATHETERIZATION 1999: COLON RESECTION No date: COLON SURGERY 05/30/2013: COLONOSCOPY     Comment:  Dr Bary Castilla 08/16/2018: COLONOSCOPY WITH PROPOFOL; N/A     Comment:  Procedure: COLONOSCOPY WITH PROPOFOL;  Surgeon: Robert Bellow, MD;  Location: [facility] ENDOSCOPY;  Service:               Endoscopy;  Laterality: N/A; BMI    Body Mass Index:  34.21 kg/m     Reproductive/Obstetrics negative OB ROS                             Anesthesia Physical Anesthesia Plan  ASA: III  Anesthesia Plan: General   Post-op Pain Management:    Induction:   PONV Risk Score and Plan:   Airway Management Planned:   Additional Equipment:   Intra-op Plan:    Post-operative Plan:   Informed Consent: I have reviewed the patients History and Physical, chart, labs and discussed the procedure including the risks, benefits and alternatives for the proposed anesthesia with the patient or authorized representative who has indicated his/her understanding and acceptance.   Dental Advisory Given  Plan Discussed with: CRNA  Anesthesia Plan Comments:         Anesthesia Quick Evaluation

## 2018-08-18 ENCOUNTER — Telehealth: Payer: Self-pay | Admitting: General Surgery

## 2018-08-18 LAB — SURGICAL PATHOLOGY

## 2018-08-18 NOTE — Telephone Encounter (Signed)
The patient was notified that 10 of the polyps that were removed were adenomatous, and this warrants a follow-up exam in 3 years.  No high-grade dysplasia.  He reports tolerating the procedure well.  Assuming his ongoing good health, we will plan for repeat exam in 3 years.

## 2018-08-29 ENCOUNTER — Other Ambulatory Visit: Payer: Self-pay | Admitting: Family Medicine

## 2018-08-29 DIAGNOSIS — Z136 Encounter for screening for cardiovascular disorders: Secondary | ICD-10-CM

## 2018-08-29 DIAGNOSIS — I482 Chronic atrial fibrillation, unspecified: Secondary | ICD-10-CM

## 2018-09-27 ENCOUNTER — Ambulatory Visit: Payer: Medicare Other

## 2018-10-09 ENCOUNTER — Other Ambulatory Visit: Payer: Self-pay | Admitting: Family Medicine

## 2018-10-09 ENCOUNTER — Ambulatory Visit
Admission: RE | Admit: 2018-10-09 | Discharge: 2018-10-09 | Disposition: A | Discharge status: Home / Self Care | Payer: Medicare Other | Source: Ambulatory Visit | Attending: Family Medicine | Admitting: Family Medicine

## 2018-10-09 DIAGNOSIS — I482 Chronic atrial fibrillation, unspecified: Secondary | ICD-10-CM

## 2018-10-09 DIAGNOSIS — Z136 Encounter for screening for cardiovascular disorders: Secondary | ICD-10-CM | POA: Insufficient documentation

## 2019-05-19 IMAGING — US US AORTA SCREENING (MEDICARE)
1 series · 14 of 25 positions shown · non-contrast
Comparison: None.

CLINICAL DATA: Atrial fibrillation, previous tobacco abuse,
hypertension, coronary artery disease, hyperlipidemia

EXAM:
ULTRASOUND OF ABDOMINAL AORTA
TECHNIQUE: Ultrasound examination of the abdominal aorta was performed to
evaluate for abdominal aortic aneurysm.

[Series 1: us aorta screening (medicare) · 14 of 25 slices shown]
[im 1/25]
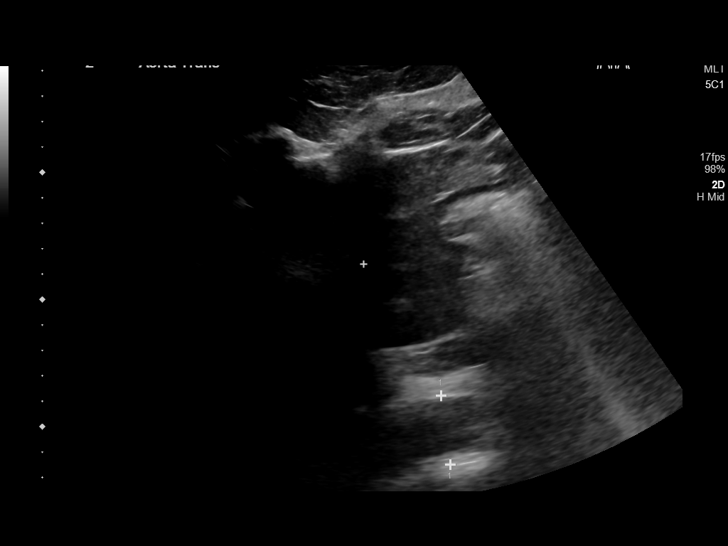
[im 3/25]
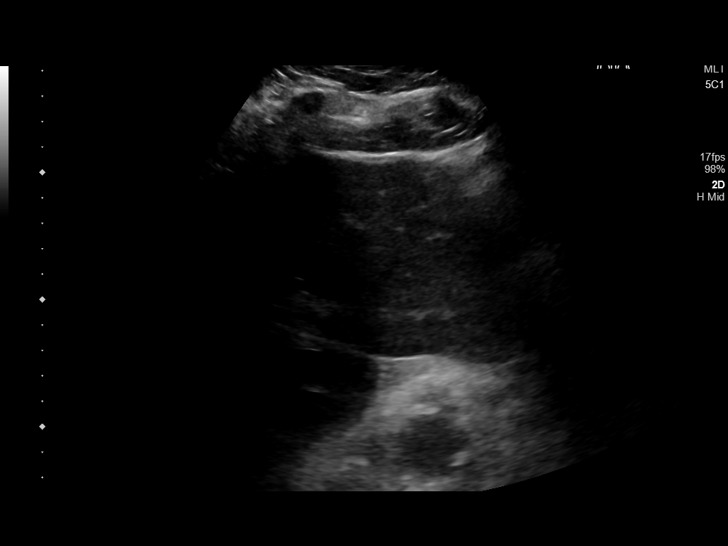
[im 5/25]
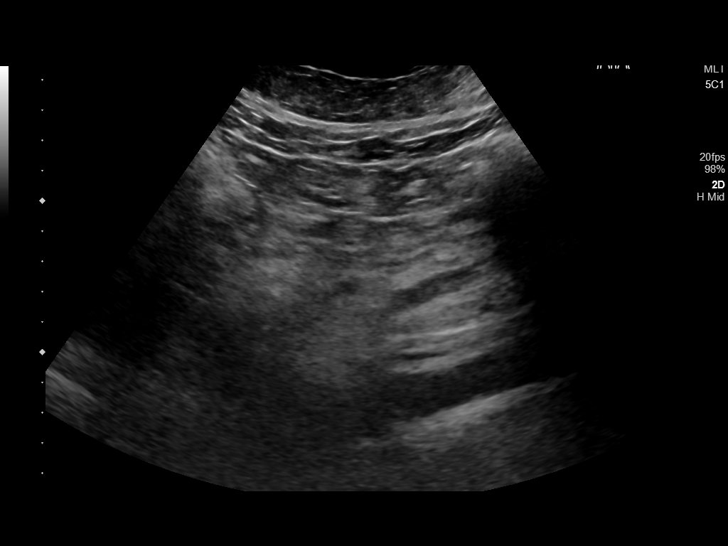
[im 7/25]
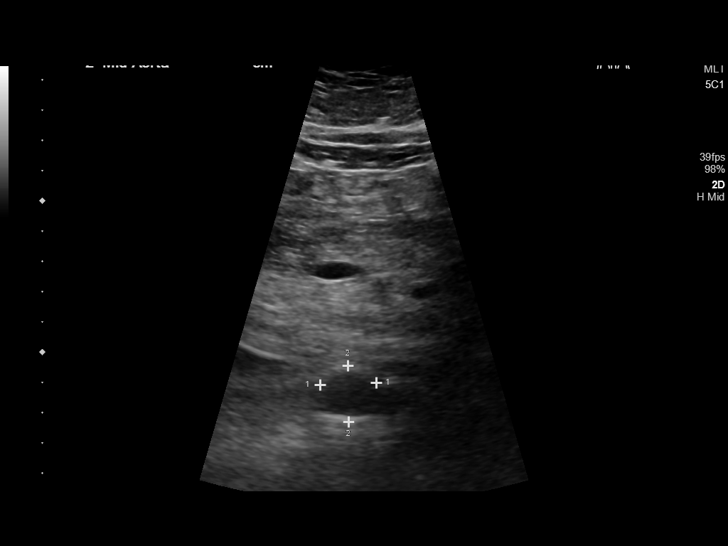
[im 9/25]
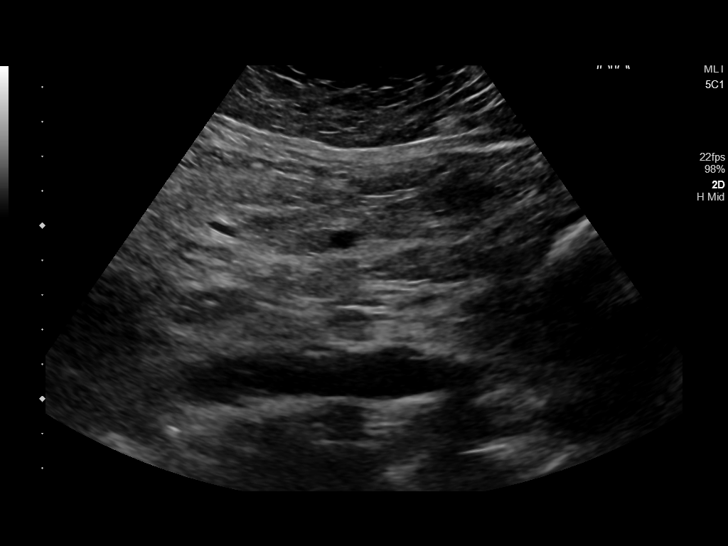
[im 10/25]
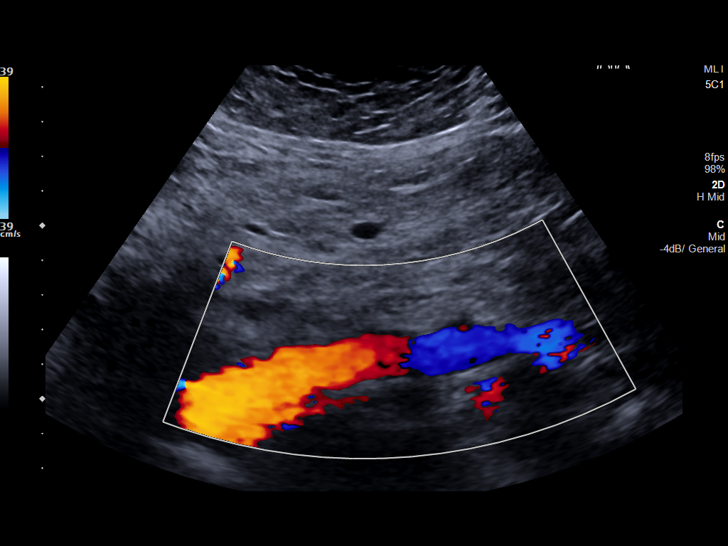
[im 12/25]
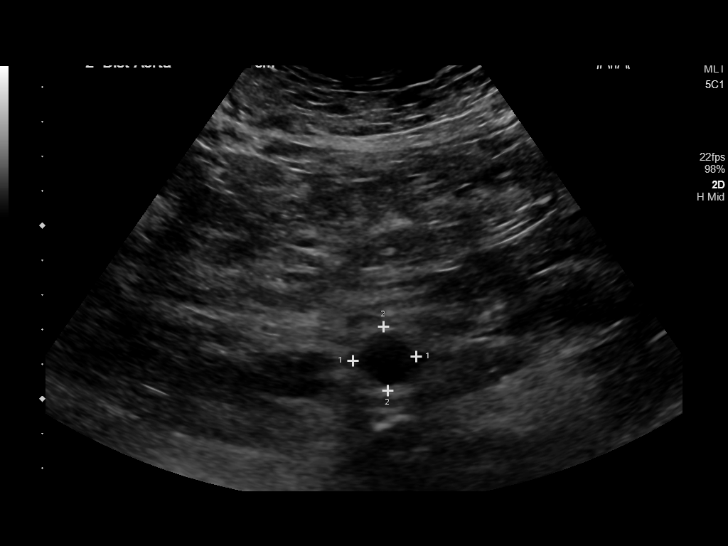
[im 14/25]
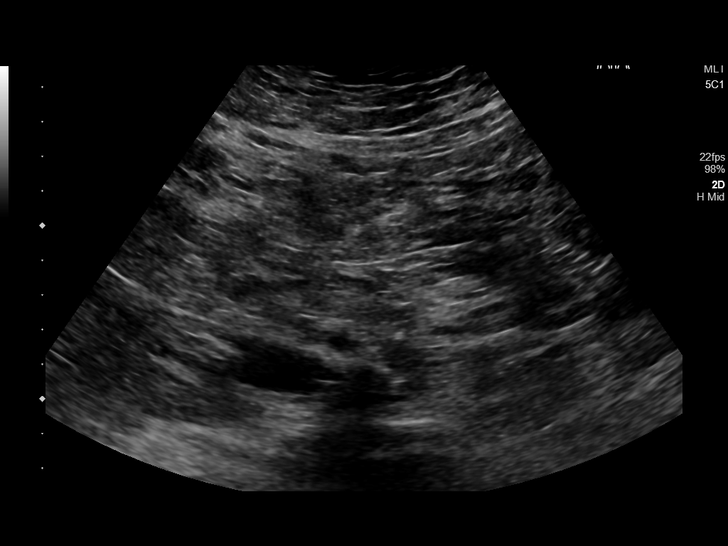
[im 16/25]
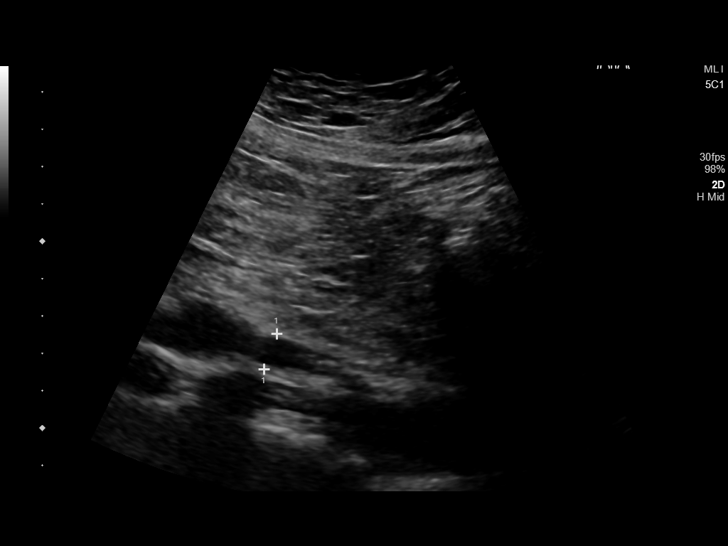
[im 17/25]
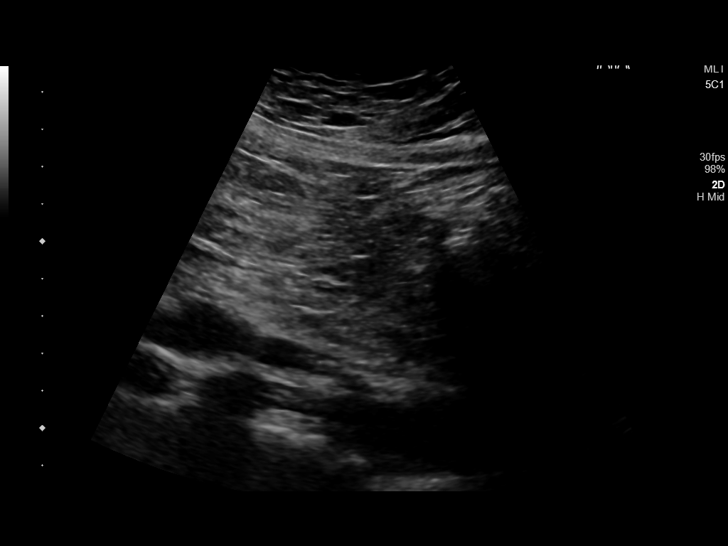
[im 19/25]
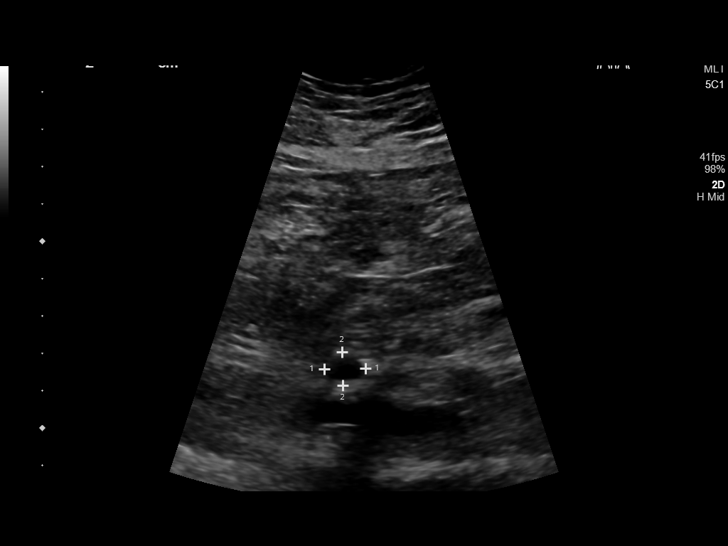
[im 21/25]
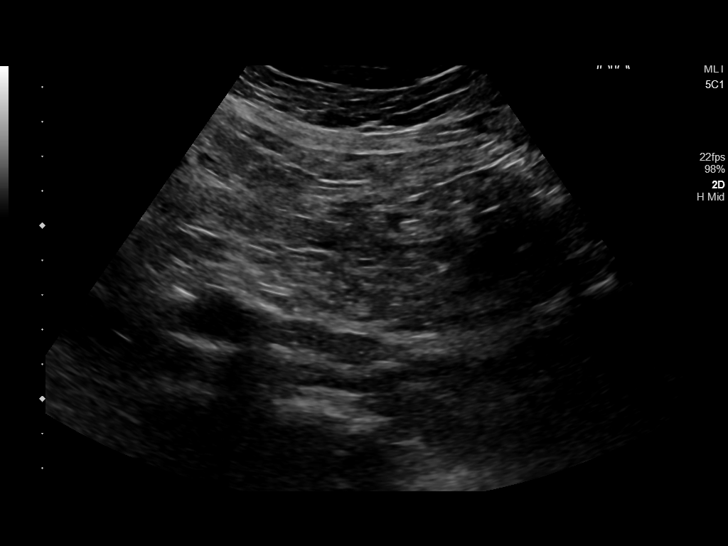
[im 23/25]
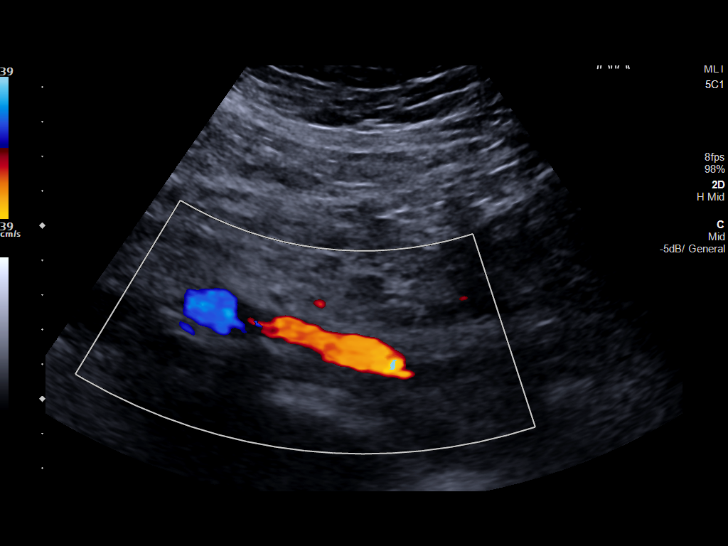
[im 25/25]
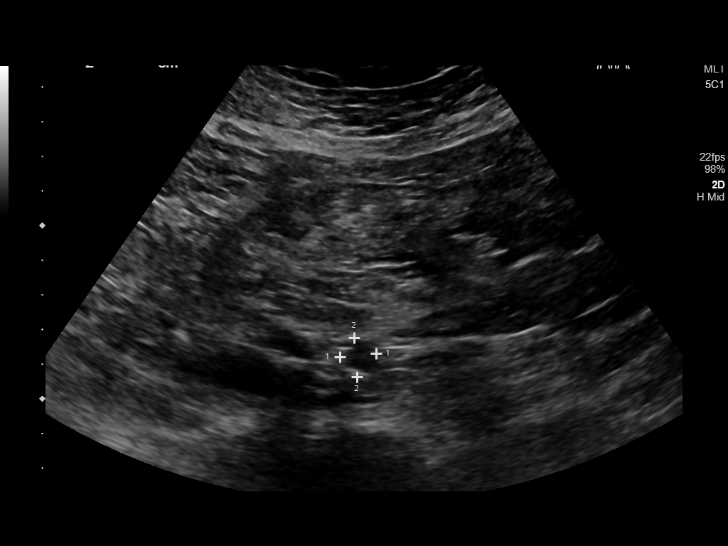

[14 of 25 positions shown; findings below may reference images not displayed]

FINDINGS: Abdominal aortic measurements as follows:

Proximal:  2.7 x 2.8 cm

Mid:  2 x 1.9 cm

Distal:  1.8 x 1.8 cm

Velocity: 111 cm/seconds

Proximal right common iliac: 1 x 1.1 cm

Proximal left common iliac: 1.1 x
IMPRESSION: Ectatic abdominal aorta at risk for aneurysm development. Recommend
followup by ultrasound in 5 years. This recommendation follows ACR
consensus guidelines: White Paper of the ACR Incidental Findings
Committee II on Vascular Findings. [HOSPITAL] 6258;

## 2020-04-23 ENCOUNTER — Ambulatory Visit
Admission: RE | Admit: 2020-04-23 | Discharge: 2020-04-23 | Disposition: A | Discharge status: Home / Self Care | Payer: Medicare Other | Source: Home / Self Care | Attending: Family Medicine | Admitting: Family Medicine

## 2020-04-23 ENCOUNTER — Other Ambulatory Visit: Payer: Self-pay

## 2020-04-23 ENCOUNTER — Other Ambulatory Visit: Payer: Self-pay | Admitting: Family Medicine

## 2020-04-23 ENCOUNTER — Ambulatory Visit
Admission: RE | Admit: 2020-04-23 | Discharge: 2020-04-23 | Disposition: A | Discharge status: Home / Self Care | Payer: Medicare Other | Source: Ambulatory Visit | Attending: Family Medicine | Admitting: Family Medicine

## 2020-04-23 DIAGNOSIS — R0600 Dyspnea, unspecified: Secondary | ICD-10-CM

## 2020-04-23 DIAGNOSIS — I509 Heart failure, unspecified: Secondary | ICD-10-CM | POA: Diagnosis not present

## 2020-04-23 DIAGNOSIS — I110 Hypertensive heart disease with heart failure: Secondary | ICD-10-CM | POA: Diagnosis not present

## 2020-04-25 ENCOUNTER — Emergency Department: Payer: Medicare Other

## 2020-04-25 ENCOUNTER — Encounter: Payer: Self-pay | Admitting: Emergency Medicine

## 2020-04-25 ENCOUNTER — Inpatient Hospital Stay
Admission: EM | Admit: 2020-04-25 | Discharge: 2020-04-29 | DRG: 291 | Disposition: A | Discharge status: Home / Self Care | Payer: Medicare Other | Attending: Internal Medicine | Admitting: Internal Medicine

## 2020-04-25 DIAGNOSIS — I5021 Acute systolic (congestive) heart failure: Secondary | ICD-10-CM | POA: Diagnosis present

## 2020-04-25 DIAGNOSIS — R0902 Hypoxemia: Secondary | ICD-10-CM | POA: Diagnosis present

## 2020-04-25 DIAGNOSIS — M109 Gout, unspecified: Secondary | ICD-10-CM | POA: Diagnosis present

## 2020-04-25 DIAGNOSIS — Z20822 Contact with and (suspected) exposure to covid-19: Secondary | ICD-10-CM | POA: Diagnosis present

## 2020-04-25 DIAGNOSIS — I340 Nonrheumatic mitral (valve) insufficiency: Secondary | ICD-10-CM | POA: Diagnosis present

## 2020-04-25 DIAGNOSIS — I110 Hypertensive heart disease with heart failure: Principal | ICD-10-CM | POA: Diagnosis present

## 2020-04-25 DIAGNOSIS — I429 Cardiomyopathy, unspecified: Secondary | ICD-10-CM | POA: Diagnosis present

## 2020-04-25 DIAGNOSIS — I452 Bifascicular block: Secondary | ICD-10-CM | POA: Diagnosis present

## 2020-04-25 DIAGNOSIS — E785 Hyperlipidemia, unspecified: Secondary | ICD-10-CM | POA: Diagnosis present

## 2020-04-25 DIAGNOSIS — Z7901 Long term (current) use of anticoagulants: Secondary | ICD-10-CM

## 2020-04-25 DIAGNOSIS — R0602 Shortness of breath: Secondary | ICD-10-CM

## 2020-04-25 DIAGNOSIS — I248 Other forms of acute ischemic heart disease: Secondary | ICD-10-CM | POA: Diagnosis present

## 2020-04-25 DIAGNOSIS — Z9049 Acquired absence of other specified parts of digestive tract: Secondary | ICD-10-CM

## 2020-04-25 DIAGNOSIS — I509 Heart failure, unspecified: Secondary | ICD-10-CM

## 2020-04-25 DIAGNOSIS — I5023 Acute on chronic systolic (congestive) heart failure: Secondary | ICD-10-CM

## 2020-04-25 DIAGNOSIS — I252 Old myocardial infarction: Secondary | ICD-10-CM

## 2020-04-25 DIAGNOSIS — Z79899 Other long term (current) drug therapy: Secondary | ICD-10-CM

## 2020-04-25 DIAGNOSIS — Z87891 Personal history of nicotine dependence: Secondary | ICD-10-CM

## 2020-04-25 DIAGNOSIS — Z888 Allergy status to other drugs, medicaments and biological substances status: Secondary | ICD-10-CM

## 2020-04-25 DIAGNOSIS — I2510 Atherosclerotic heart disease of native coronary artery without angina pectoris: Secondary | ICD-10-CM | POA: Diagnosis present

## 2020-04-25 DIAGNOSIS — I480 Paroxysmal atrial fibrillation: Secondary | ICD-10-CM | POA: Diagnosis present

## 2020-04-25 DIAGNOSIS — I10 Essential (primary) hypertension: Secondary | ICD-10-CM | POA: Diagnosis present

## 2020-04-25 LAB — CBC
HCT: 42.8 % (ref 39.0–52.0)
Hemoglobin: 13.4 g/dL (ref 13.0–17.0)
MCH: 30 pg (ref 26.0–34.0)
MCHC: 31.3 g/dL (ref 30.0–36.0)
MCV: 95.7 fL (ref 80.0–100.0)
Platelets: 183 10*3/uL (ref 150–400)
RBC: 4.47 MIL/uL (ref 4.22–5.81)
RDW: 15.7 % — ABNORMAL HIGH (ref 11.5–15.5)
WBC: 9.2 10*3/uL (ref 4.0–10.5)
nRBC: 0 % (ref 0.0–0.2)

## 2020-04-25 NOTE — ED Triage Notes (Signed)
Shortness of breath for 3 days tonight not able to cath breath, feeling fatigued. Pt not able to talk in complete sentences only nods yes or no.

## 2020-04-25 NOTE — ED Provider Notes (Signed)
[facility] Emergency Department Provider Note  ____________________________________________   First MD Initiated Contact with Patient 04/25/20 2329     (approximate)  I have reviewed the triage vital signs and the nursing notes.  History  Chief Complaint Shortness of Breath    HPI John [name] is a 79 y.o. male with past medical history of CAD, atrial fibrillation on anticoagulation and digoxin, who presents to the emergency department for shortness of breath.  Patient states his symptoms first started on Monday.  They have been constant since onset and progressively worsening.  He reports an associated mild, dry cough.  Denies any associated fevers, nausea, vomiting, diarrhea.  No chest pain.  Denies any sick contacts.  Has been fully vaccinated against COVID.  Also reports associated symmetric, bilateral lower extremity swelling, though is unclear as to the timeline for this.  On arrival to the emergency department he was hypoxic, requiring supplemental oxygen.  Does not normally require supplemental oxygen.  No history of asthma or COPD.  Denies any history of heart failure.  Takes his digoxin nightly, did take his dose prior to arrival in the ED this evening.   Past Medical Hx Past Medical History:  Diagnosis Date  . A-fib ([place])   . Bowel trouble 1999  . Cardiac disease 1987  . 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 Heart attack ([place]) 1987  . History of angina 1965  . History of cardioversion   . Hyperlipidemia   . Hypertension 2007    Problem List Patient Active Problem List   Diagnosis Date Noted  . Personal history of colonic polyps 04/04/2013  . Bowel trouble   . Cardiac disease     Past Surgical Hx Past Surgical History:  Procedure Laterality Date  . CARDIAC CATHETERIZATION    . COLON RESECTION  1999  . COLON SURGERY    . COLONOSCOPY  05/30/2013   Dr Bary Castilla  . COLONOSCOPY WITH PROPOFOL N/A 08/16/2018   Procedure:  COLONOSCOPY WITH PROPOFOL;  Surgeon: Robert Bellow, MD;  Location: [facility] ENDOSCOPY;  Service: Endoscopy;  Laterality: N/A;    Medications Prior to Admission medications   Medication Sig Start Date End Date Taking? Authorizing Provider  amLODipine (NORVASC) 10 MG tablet Take 10 mg by mouth daily. 03/24/20   [provider]  atorvastatin (LIPITOR) 80 MG tablet TAKE 1 TABLET (80 MG TOTAL) BY MOUTH ONCE DAILY. 03/20/18   [provider]  Cetirizine HCl (ZYRTEC ALLERGY PO) Take by mouth.    [provider]  digoxin (LANOXIN) 0.125 MG tablet Take 0.125 mg by mouth daily.    [provider]  ezetimibe (ZETIA) 10 MG tablet Take by mouth. 02/01/17 06/06/18  [provider]  lisinopril (PRINIVIL,ZESTRIL) 40 MG tablet Take 40 mg by mouth 2 (two) times daily.    [provider]  LYRICA 150 MG capsule  03/09/17   [provider]  nitroGLYCERIN (NITROSTAT) 0.4 MG SL tablet Place 0.4 mg under the tongue every 5 (five) minutes as needed for chest pain.    [provider]  warfarin (COUMADIN) 1 MG tablet Take as directed up to 3 per day.    [provider]  warfarin (COUMADIN) 5 MG tablet Use as directed. Take up to 3 per day.    [provider]    Allergies Procan sr [procainamide]  Family Hx Family History  Problem Relation Age of Onset  . Other Father  brain tumor    Social Hx Social History   Tobacco Use  . Smoking status: Former Smoker    Years: 30.00  . Smokeless tobacco: Never Used  Substance Use Topics  . Alcohol use: Not Currently    Alcohol/week: 7.0 standard drinks    Types: 7 Glasses of wine per week    Comment: 1 glass red wine daily  . Drug use: No     Review of Systems  Constitutional: Negative for fever. Negative for chills. Eyes: Negative for visual changes. ENT: Negative for sore throat. Cardiovascular: Negative for chest pain. Respiratory: Positive for shortness of  breath. Gastrointestinal: Negative for nausea. Negative for vomiting.  Genitourinary: Negative for dysuria. Musculoskeletal: Negative for leg swelling. Skin: Negative for rash. Neurological: Negative for headaches.   Physical Exam  Vital Signs: ED Triage Vitals  Enc Vitals Group     BP 04/25/20 2312 (!) 164/74     Pulse Rate 04/25/20 2312 (!) 122     Resp 04/25/20 2312 (!) 28     Temp 04/25/20 2312 98.5 F (36.9 C)     Temp Source 04/25/20 2312 Oral     SpO2 04/25/20 2312 (!) 83 %     Weight 04/25/20 2313 207 lb (93.9 kg)     Height 04/25/20 2313 5\' 8"  (1.727 m)     Head Circumference --      Peak Flow --      Pain Score 04/25/20 2312 0     Pain Loc --      Pain Edu? --      Excl. in [place]? --     Constitutional: Alert and oriented.  Appears slightly short of breath when moving from wheelchair to stretcher, but otherwise able to speak in full sentences. Head: Normocephalic. Atraumatic. Eyes: Conjunctivae clear. Sclera anicteric. Pupils equal and symmetric. Nose: No masses or lesions. No congestion or rhinorrhea. Mouth/Throat: Wearing mask.  Neck: No stridor. Trachea midline.  Cardiovascular: Irregular rhythm, rate controlled once relaxed.  Extremities well perfused. Respiratory: Slight tachypnea.  Oxygen 83% on RA, improved to 97% on 2 to 3 L [place].  Wheezing bilaterally, suspect cardiac etiology. Gastrointestinal: Soft. Non-distended. Non-tender.  Genitourinary: Deferred. Musculoskeletal: Symmetric pitting edema to the bilateral lower extremities. No deformities. Neurologic:  Normal speech and language. No gross focal or lateralizing neurologic deficits are appreciated.  Skin: Skin is warm, dry and intact. No rash noted. Psychiatric: Mood and affect are appropriate for situation.  EKG  Personally reviewed and interpreted by myself.   Date: 04/25/20 Time: 2307 Rate: 122 Rhythm: irregular AF w/ significant premature contractions Axis: difficult to assess Intervals:  prolonged QRS 2/2 IVCD, prolonged QT Atrial fibrillation, elevated heart rate, significant PVC burden No priors for comparison   Repeat obtained at 2329 Rate: 109 Rhythm: Atrial fibrillation with multiple premature contractions Axis: rightward IVCD, QTC 464 ms  Repeat obtained 04/26/2020 at 0008 Rate: 90 Rhythm: Atrial fibrillation with PVCs Axis: seemingly rightward QRS abnormal due to IVCD    Radiology  Personally reviewed available imaging myself.   CXR - IMPRESSION:  1. Findings consistent with developing pulmonary edema.    Procedures  Procedure(s) performed (including critical care):  .1-3 Lead EKG Interpretation Performed by: Lilia Pro., MD Authorized by: Lilia Pro., MD     Interpretation: abnormal     ECG rate assessment: normal     Rhythm: atrial fibrillation     Ectopy: PVCs   Comments:     Indication: Shortness of breath Impression:  Abnormal, atrial fibrillation, rate controlled when resting, high PVC burden     Initial Impression / Assessment and Plan / MDM / ED Course  79 y.o. male who presents to the ED for shortness of breath, noted to be hypoxic in triage requiring 2 L nasal cannula.  Exam notable for bilateral wheezing, suspect cardiac in etiology, as well as bilateral, symmetric lower extremity edema.  Ddx: HF exacerbation, pulmonary infection, COVID (though less likely as he is fully vaccinated), symptomatic anemia, symptomatic atrial fibrillation  Will plan for labs, imaging, cardiac monitoring, supplemental oxygen  Clinical Course as of Apr 27 755  Sat Apr 26, 2020  0147 CXR reveals developing pulmonary edema.  Initial at bedtime troponin slightly elevated at 28, likely secondary to demand.  COVID swab negative.  Will give a dose of IV Lasix and plan to admit for further diuresis.   [SM]    Clinical Course User Index [SM] Lilia Pro., MD     _______________________________   As part of my medical decision making I  have reviewed available labs, radiology tests, reviewed old records/performed chart review, obtained additional history from family.    Final Clinical Impression(s) / ED Diagnosis  Final diagnoses:  SOB (shortness of breath)  Hypoxia       Note:  This document was prepared using Dragon voice recognition software and may include unintentional dictation errors.   Lilia Pro., MD 04/26/20 [phone]

## 2020-04-26 ENCOUNTER — Inpatient Hospital Stay
Admit: 2020-04-26 | Discharge: 2020-04-26 | Disposition: A | Discharge status: Home / Self Care | Payer: Medicare Other | Attending: Family Medicine | Admitting: Family Medicine

## 2020-04-26 ENCOUNTER — Other Ambulatory Visit: Payer: Self-pay

## 2020-04-26 ENCOUNTER — Encounter: Payer: Self-pay | Admitting: Family Medicine

## 2020-04-26 DIAGNOSIS — I10 Essential (primary) hypertension: Secondary | ICD-10-CM | POA: Diagnosis not present

## 2020-04-26 DIAGNOSIS — Z888 Allergy status to other drugs, medicaments and biological substances status: Secondary | ICD-10-CM | POA: Diagnosis not present

## 2020-04-26 DIAGNOSIS — I452 Bifascicular block: Secondary | ICD-10-CM | POA: Diagnosis present

## 2020-04-26 DIAGNOSIS — I252 Old myocardial infarction: Secondary | ICD-10-CM | POA: Diagnosis not present

## 2020-04-26 DIAGNOSIS — I429 Cardiomyopathy, unspecified: Secondary | ICD-10-CM | POA: Diagnosis present

## 2020-04-26 DIAGNOSIS — I340 Nonrheumatic mitral (valve) insufficiency: Secondary | ICD-10-CM | POA: Diagnosis present

## 2020-04-26 DIAGNOSIS — I480 Paroxysmal atrial fibrillation: Secondary | ICD-10-CM | POA: Diagnosis present

## 2020-04-26 DIAGNOSIS — I4891 Unspecified atrial fibrillation: Secondary | ICD-10-CM

## 2020-04-26 DIAGNOSIS — Z20822 Contact with and (suspected) exposure to covid-19: Secondary | ICD-10-CM | POA: Diagnosis present

## 2020-04-26 DIAGNOSIS — M1000 Idiopathic gout, unspecified site: Secondary | ICD-10-CM | POA: Diagnosis not present

## 2020-04-26 DIAGNOSIS — E785 Hyperlipidemia, unspecified: Secondary | ICD-10-CM | POA: Diagnosis present

## 2020-04-26 DIAGNOSIS — M1A9XX0 Chronic gout, unspecified, without tophus (tophi): Secondary | ICD-10-CM | POA: Diagnosis not present

## 2020-04-26 DIAGNOSIS — I509 Heart failure, unspecified: Secondary | ICD-10-CM

## 2020-04-26 DIAGNOSIS — M109 Gout, unspecified: Secondary | ICD-10-CM | POA: Diagnosis present

## 2020-04-26 DIAGNOSIS — I5031 Acute diastolic (congestive) heart failure: Secondary | ICD-10-CM

## 2020-04-26 DIAGNOSIS — I248 Other forms of acute ischemic heart disease: Secondary | ICD-10-CM | POA: Diagnosis present

## 2020-04-26 DIAGNOSIS — Z7901 Long term (current) use of anticoagulants: Secondary | ICD-10-CM | POA: Diagnosis not present

## 2020-04-26 DIAGNOSIS — Z79899 Other long term (current) drug therapy: Secondary | ICD-10-CM | POA: Diagnosis not present

## 2020-04-26 DIAGNOSIS — I2510 Atherosclerotic heart disease of native coronary artery without angina pectoris: Secondary | ICD-10-CM | POA: Diagnosis present

## 2020-04-26 DIAGNOSIS — Z9049 Acquired absence of other specified parts of digestive tract: Secondary | ICD-10-CM | POA: Diagnosis not present

## 2020-04-26 DIAGNOSIS — Z87891 Personal history of nicotine dependence: Secondary | ICD-10-CM | POA: Diagnosis not present

## 2020-04-26 DIAGNOSIS — I5021 Acute systolic (congestive) heart failure: Secondary | ICD-10-CM | POA: Diagnosis present

## 2020-04-26 DIAGNOSIS — I110 Hypertensive heart disease with heart failure: Secondary | ICD-10-CM | POA: Diagnosis present

## 2020-04-26 DIAGNOSIS — R0902 Hypoxemia: Secondary | ICD-10-CM | POA: Diagnosis present

## 2020-04-26 LAB — BASIC METABOLIC PANEL
Anion gap: 11 (ref 5–15)
BUN: 24 mg/dL — ABNORMAL HIGH (ref 8–23)
CO2: 22 mmol/L (ref 22–32)
Calcium: 9.3 mg/dL (ref 8.9–10.3)
Chloride: 108 mmol/L (ref 98–111)
Creatinine, Ser: 0.79 mg/dL (ref 0.61–1.24)
GFR calc Af Amer: >60 mL/min (ref >60)
GFR calc non Af Amer: >60 mL/min (ref >60)
Glucose, Bld: 160 mg/dL — ABNORMAL HIGH (ref 70–99)
Potassium: 3.9 mmol/L (ref 3.5–5.1)
Sodium: 141 mmol/L (ref 135–145)

## 2020-04-26 LAB — TROPONIN I (HIGH SENSITIVITY)
Troponin I (High Sensitivity): 28 ng/L — ABNORMAL HIGH (ref <18)
Troponin I (High Sensitivity): 59 ng/L — ABNORMAL HIGH (ref <18)

## 2020-04-26 LAB — PROTIME-INR
INR: 2.4 — ABNORMAL HIGH (ref 0.8–1.2)
Prothrombin Time: 25.3 seconds — ABNORMAL HIGH (ref 11.4–15.2)

## 2020-04-26 LAB — RESPIRATORY PANEL BY RT PCR (FLU A&B, COVID)
Influenza A by PCR: NEGATIVE
Influenza B by PCR: NEGATIVE
SARS Coronavirus 2 by RT PCR: NEGATIVE

## 2020-04-26 LAB — MAGNESIUM: Magnesium: 2.1 mg/dL (ref 1.7–2.4)

## 2020-04-26 LAB — DIGOXIN LEVEL: Digoxin Level: 0.6 ng/mL — ABNORMAL LOW (ref 0.8–2.0)

## 2020-04-26 LAB — BRAIN NATRIURETIC PEPTIDE: B Natriuretic Peptide: 232 pg/mL — ABNORMAL HIGH (ref 0.0–100.0)

## 2020-04-26 MED ORDER — WARFARIN SODIUM 1 MG PO TABS: 1.0000 mg | ORAL_TABLET | Freq: Every day | ORAL | Status: DC

## 2020-04-26 MED ORDER — LISINOPRIL 20 MG PO TABS
40.0000 mg | ORAL_TABLET | Freq: Every day | ORAL | Status: DC
  Administered 2020-04-26 – 2020-04-29 (×4): 40 mg via ORAL
  Filled 2020-04-26: qty 4
  Filled 2020-04-26 (×3): qty 2

## 2020-04-26 MED ORDER — DIGOXIN 125 MCG PO TABS
0.1250 mg | ORAL_TABLET | Freq: Every day | ORAL | Status: DC
  Administered 2020-04-26: 11:00:00 0.125 mg via ORAL
  Filled 2020-04-26 (×2): qty 1

## 2020-04-26 MED ORDER — NITROGLYCERIN 0.4 MG SL SUBL: 0.4000 mg | SUBLINGUAL_TABLET | SUBLINGUAL | Status: DC | PRN

## 2020-04-26 MED ORDER — ATORVASTATIN CALCIUM 80 MG PO TABS
80.0000 mg | ORAL_TABLET | Freq: Every day | ORAL | Status: DC
  Administered 2020-04-26 – 2020-04-28 (×3): 80 mg via ORAL
  Filled 2020-04-26 (×3): qty 1

## 2020-04-26 MED ORDER — EZETIMIBE 10 MG PO TABS
10.0000 mg | ORAL_TABLET | Freq: Every day | ORAL | Status: DC
  Administered 2020-04-26 – 2020-04-29 (×4): 10 mg via ORAL
  Filled 2020-04-26 (×4): qty 1

## 2020-04-26 MED ORDER — FUROSEMIDE 10 MG/ML IJ SOLN
40.0000 mg | Freq: Once | INTRAMUSCULAR | Status: AC
  Administered 2020-04-26: 01:00:00 40 mg via INTRAVENOUS
  Filled 2020-04-26: qty 4

## 2020-04-26 MED ORDER — LORATADINE 10 MG PO TABS
10.0000 mg | ORAL_TABLET | Freq: Every day | ORAL | Status: DC
  Administered 2020-04-26 – 2020-04-29 (×4): 10 mg via ORAL
  Filled 2020-04-26 (×4): qty 1

## 2020-04-26 MED ORDER — WARFARIN SODIUM 6 MG PO TABS
6.0000 mg | ORAL_TABLET | Freq: Once | ORAL | Status: AC
  Administered 2020-04-26: 17:00:00 6 mg via ORAL
  Filled 2020-04-26 (×2): qty 1

## 2020-04-26 MED ORDER — SODIUM CHLORIDE 0.9% FLUSH
3.0000 mL | Freq: Two times a day (BID) | INTRAVENOUS | Status: DC
  Administered 2020-04-26 – 2020-04-28 (×4): 3 mL via INTRAVENOUS

## 2020-04-26 MED ORDER — SODIUM CHLORIDE 0.9% FLUSH: 3.0000 mL | INTRAVENOUS | Status: DC | PRN

## 2020-04-26 MED ORDER — ONDANSETRON HCL 4 MG/2ML IJ SOLN: 4.0000 mg | Freq: Four times a day (QID) | INTRAMUSCULAR | Status: DC | PRN

## 2020-04-26 MED ORDER — WARFARIN - PHARMACIST DOSING INPATIENT
Freq: Every day | Status: DC
  Filled 2020-04-26: qty 1

## 2020-04-26 MED ORDER — WARFARIN SODIUM 5 MG PO TABS: 5.0000 mg | ORAL_TABLET | Freq: Every day | ORAL | Status: DC

## 2020-04-26 MED ORDER — ACETAMINOPHEN 325 MG PO TABS: 650.0000 mg | ORAL_TABLET | ORAL | Status: DC | PRN

## 2020-04-26 MED ORDER — WARFARIN SODIUM 5 MG PO TABS
5.0000 mg | ORAL_TABLET | Freq: Once | ORAL | Status: DC
  Filled 2020-04-26: qty 1

## 2020-04-26 MED ORDER — FUROSEMIDE 10 MG/ML IJ SOLN
40.0000 mg | Freq: Two times a day (BID) | INTRAMUSCULAR | Status: DC
  Administered 2020-04-26 – 2020-04-29 (×7): 40 mg via INTRAVENOUS
  Filled 2020-04-26 (×7): qty 4

## 2020-04-26 MED ORDER — SODIUM CHLORIDE 0.9 % IV SOLN: 250.0000 mL | INTRAVENOUS | Status: DC | PRN

## 2020-04-26 MED ORDER — PREGABALIN 75 MG PO CAPS
150.0000 mg | ORAL_CAPSULE | Freq: Every day | ORAL | Status: DC
  Administered 2020-04-26 – 2020-04-29 (×4): 150 mg via ORAL
  Filled 2020-04-26 (×4): qty 2

## 2020-04-26 MED ORDER — AMLODIPINE BESYLATE 10 MG PO TABS
10.0000 mg | ORAL_TABLET | Freq: Every day | ORAL | Status: DC
  Administered 2020-04-26 – 2020-04-29 (×4): 10 mg via ORAL
  Filled 2020-04-26 (×3): qty 1
  Filled 2020-04-26: qty 2

## 2020-04-26 MED ORDER — ZOLPIDEM TARTRATE 5 MG PO TABS: 5.0000 mg | ORAL_TABLET | Freq: Every evening | ORAL | Status: DC | PRN

## 2020-04-26 NOTE — Consult Note (Signed)
[facility] Consultation Note  Patient ID: John [name], MRN: [id number], DOB/AGE: 1941/06/20 79 y.o. Admit date: 04/25/2020   Date of Consult: 04/26/2020 Primary Physician: Gayland Curry, MD Primary Cardiologist: Ubaldo Glassing  Chief Complaint:  Chief Complaint  Patient presents with  . Shortness of Breath   Reason for Consult: Heart failure  HPI: 79 y.o. male with known chronic nonvalvular atrial fibrillation hypertension hyperlipidemia who has had significant new onset of lower extremity edema and shortness of breath progressing over a 1 week..  The patient has had significant shortness of breath and unable to do anything he wishes with PND and orthopnea.  When seen in the emergency room the patient had a chest x-ray showing pulmonary edema.  Additionally BNP was 232 and troponin 59.  He did have some mild chest pressure with his shortness of breath on occasion but this is not consistent with true angina.  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  After intravenous Lasix the patient did have significant improvements in shortness of breath and is much more comfortable at this time.  Past Medical History:  Diagnosis Date  . A-fib ([place])   . Bowel trouble 1999  . Cardiac disease 1987  . 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 Heart attack ([place]) 1987  . History of angina 1965  . History of cardioversion   . Hyperlipidemia   . Hypertension 2007      Surgical History:  Past Surgical History:  Procedure Laterality Date  . CARDIAC CATHETERIZATION    . COLON RESECTION  1999  . COLON SURGERY    . COLONOSCOPY  05/30/2013   Dr Bary Castilla  . COLONOSCOPY WITH PROPOFOL N/A 08/16/2018   Procedure: COLONOSCOPY WITH PROPOFOL;  Surgeon: Robert Bellow, MD;  Location: [facility] ENDOSCOPY;  Service: Endoscopy;  Laterality: N/A;     Home Meds: Prior to Admission medications   Medication Sig Start Date End Date Taking? Authorizing  Provider  amLODipine (NORVASC) 10 MG tablet Take 10 mg by mouth daily. 03/24/20  Yes [provider]  atorvastatin (LIPITOR) 80 MG tablet TAKE 1 TABLET (80 MG TOTAL) BY MOUTH ONCE DAILY. 03/20/18  Yes [provider]  Cetirizine HCl (ZYRTEC ALLERGY PO) Take 1 tablet by mouth daily.    Yes [provider]  digoxin (LANOXIN) 0.125 MG tablet Take 0.125 mg by mouth daily.   Yes [provider]  ezetimibe (ZETIA) 10 MG tablet Take by mouth. 02/01/17 04/25/20 Yes [provider]  lisinopril (PRINIVIL,ZESTRIL) 40 MG tablet Take 40 mg by mouth daily.    Yes [provider]  LYRICA 150 MG capsule Take 150 mg by mouth daily.  03/09/17  Yes [provider]  nitroGLYCERIN (NITROSTAT) 0.4 MG SL tablet Place 0.4 mg under the tongue every 5 (five) minutes as needed for chest pain.   Yes [provider]  warfarin (COUMADIN) 1 MG tablet Take 1 mg by mouth daily at 4 PM. Take as directed with 5 mg tablet for dose of 6mg -7 mg daily   Yes [provider]  warfarin (COUMADIN) 5 MG tablet Take 5 mg by mouth daily at 4 PM. Ad directed with 1 mg tablet for daily dose of 6-7 mg   Yes [provider]    Inpatient Medications:  . amLODipine  10 mg Oral Daily  . atorvastatin  80 mg Oral Daily  . digoxin  0.125 mg Oral Daily  . ezetimibe  10 mg Oral Daily  . furosemide  40 mg Intravenous Q12H  . lisinopril  40 mg Oral Daily  . loratadine  10 mg Oral Daily  . pregabalin  150 mg Oral Daily  . sodium chloride flush  3 mL Intravenous Q12H  . warfarin  5 mg Oral ONCE-1600  . Warfarin - Pharmacist Dosing Inpatient   Does not apply q1600   . sodium chloride      Allergies:  Allergies  Allergen Reactions  . Procan Sr [Procainamide] Rash    Social History   Socioeconomic History  . Marital status: Married    Spouse name: Not on file  . Number of children: Not on file  . Years of education: Not on file  . Highest education level:  Not on file  Occupational History  . Not on file  Tobacco Use  . Smoking status: Former Smoker    Years: 30.00  . Smokeless tobacco: Never Used  Substance and Sexual Activity  . Alcohol use: Not Currently    Alcohol/week: 7.0 standard drinks    Types: 7 Glasses of wine per week    Comment: 1 glass red wine daily  . Drug use: No  . Sexual activity: Not on file  Other Topics Concern  . Not on file  Social History Narrative  . Not on file   Social Determinants of Health   Financial Resource Strain:   . Difficulty of Paying Living Expenses:   Food Insecurity:   . Worried About Charity fundraiser in the Last Year:   . Arboriculturist in the Last Year:   Transportation Needs:   . Film/video editor (Medical):   Marland Kitchen Lack of Transportation (Non-Medical):   Physical Activity:   . Days of Exercise per Week:   . Minutes of Exercise per Session:   Stress:   . Feeling of Stress :   Social Connections:   . Frequency of Communication with Friends and Family:   . Frequency of Social Gatherings with Friends and Family:   . Attends Religious Services:   . Active Member of Clubs or Organizations:   . Attends Archivist Meetings:   Marland Kitchen Marital Status:   Intimate Partner Violence:   . Fear of Current or Ex-Partner:   . Emotionally Abused:   Marland Kitchen Physically Abused:   . Sexually Abused:      Family History  Problem Relation Age of Onset  . Other Father        brain tumor     Review of Systems Positive for shortness of breath lower extremity edema Negative for: General:  chills, fever, night sweats or weight changes.  Cardiovascular: Positive for PND orthopnea negative for syncope dizziness  Dermatological skin lesions rashes Respiratory: Cough congestion Urologic: Frequent urination urination at night and hematuria Abdominal: negative for nausea, vomiting, diarrhea, bright red blood per rectum, melena, or hematemesis Neurologic: negative for visual changes, and/or  hearing changes  All other systems reviewed and are otherwise negative except as noted above.  Labs: No results for input(s): CKTOTAL, CKMB, TROPONINI in the last 72 hours. Lab Results  Component Value Date   WBC 9.2 04/25/2020   HGB 13.4 04/25/2020   HCT 42.8 04/25/2020   MCV 95.7 04/25/2020   PLT 183 04/25/2020    Recent Labs  Lab 04/25/20 2325  NA 141  K 3.9  CL 108  CO2 22  BUN 24*  CREATININE 0.79  CALCIUM 9.3  GLUCOSE 160*  No results found for: CHOL, HDL, LDLCALC, TRIG No results found for: DDIMER  Radiology/Studies:  DG Chest 1 View  Result Date: 04/25/2020 CLINICAL DATA:  Shortness of breath for 3 days, fatigue EXAM: CHEST  1 VIEW COMPARISON:  04/23/2020 FINDINGS: Single frontal view of the chest demonstrates an enlarged cardiac silhouette. There is increased vascular congestion with diffuse interstitial prominence. Trace bilateral effusions. No pneumothorax. IMPRESSION: 1. Findings consistent with developing pulmonary edema. Electronically Signed   By: Randa Ngo M.D.   On: 04/25/2020 23:51   DG Chest 2 View  Result Date: 04/23/2020 CLINICAL DATA:  Dyspnea and short of breath EXAM: CHEST - 2 VIEW COMPARISON:  None. FINDINGS: Small right-sided pleural effusion. Cardiomegaly with aortic atherosclerosis. No pneumothorax. IMPRESSION: 1. Mild cardiomegaly with small right-sided pleural effusion. Electronically Signed   By: Donavan Foil M.D.   On: 04/23/2020 23:14    EKG: Atrial fibrillation with left anterior fascicular block  Weights: Filed Weights   04/25/20 2313  Weight: 93.9 kg     Physical Exam: Blood pressure (!) 134/57, pulse (!) 56, temperature 98.5 F (36.9 C), temperature source Oral, resp. rate 12, height 5\' 8"  (1.727 m), weight 93.9 kg, SpO2 95 %. Body mass index is 31.47 kg/m. General: Well developed, well nourished, in no acute distress. Head eyes ears nose throat: Normocephalic, atraumatic, sclera non-icteric, no xanthomas, nares are without  discharge. No apparent thyromegaly and/or mass  Lungs: Normal respiratory effort.  no wheezes, no rales, no rhonchi.  Heart: Irregular with normal S1 S2. no murmur gallop, no rub, PMI is normal size and placement, carotid upstroke normal without bruit, jugular venous pressure is normal Abdomen: Soft, non-tender, non-distended with normoactive bowel sounds. No hepatomegaly. No rebound/guarding. No obvious abdominal masses. Abdominal aorta is normal size without bruit Extremities: No edema. no cyanosis, no clubbing, no ulcers  Peripheral : 2+ bilateral upper extremity pulses, 2+ bilateral femoral pulses, 2+ bilateral dorsal pedal pulse Neuro: Alert and oriented. No facial asymmetry. No focal deficit. Moves all extremities spontaneously. Musculoskeletal: Normal muscle tone without kyphosis Psych:  Responds to questions appropriately with a normal affect.    Assessment: 79 year old male with chronic nonvalvular atrial fibrillation hypertension hyperlipidemia with acute diastolic dysfunction congestive heart failure with pulmonary edema and lower extremity edema and elevated troponin consistent with demand ischemia rather than acute coronary syndrome  Plan: 1.  Continue intravenous Lasix for pulmonary edema lower extremity edema and acute diastolic dysfunction heart failure 2.  Echocardiogram for LV systolic dysfunction valvular heart disease to assess for cause of heart failure 3.  Continue hypertension control and heart rate control with current medical regimen without change 4.  Continue anticoagulation with warfarin for further risk reduction in stroke with atrial fibrillation 5.  Further diagnostic testing and treatment options after above  Signed, Corey Skains M.D. [facility] 04/26/2020, 7:09 AM

## 2020-04-26 NOTE — Progress Notes (Signed)
*  PRELIMINARY RESULTS* Echocardiogram 2D Echocardiogram has been performed.  Sherrie Sport 04/26/2020, 3:50 PM

## 2020-04-26 NOTE — Progress Notes (Signed)
ANTICOAGULATION CONSULT NOTE - Initial Consult  Pharmacy Consult for warfarin Indication: atrial fibrillation  Allergies  Allergen Reactions  . Procan Sr [Procainamide] Rash    Patient Measurements: Height: 5\' 8"  (172.7 cm) Weight: 93.9 kg (207 lb) IBW/kg (Calculated) : 68.4  Vital Signs: Temp: 98.5 F (36.9 C) (05/07 2312) Temp Source: Oral (05/07 2312) BP: 150/77 (05/08 0830) Pulse Rate: 73 (05/08 0830)  Labs: Recent Labs    04/25/20 2325 04/26/20 0149  HGB 13.4  --   HCT 42.8  --   PLT 183  --   LABPROT 25.3*  --   INR 2.4*  --   CREATININE 0.79  --   TROPONINIHS 28* 59*    Estimated Creatinine Clearance: 80.5 mL/min (by C-G formula based on SCr of 0.79 mg/dL).   Medical History: Past Medical History:  Diagnosis Date  . A-fib ([place])   . Bowel trouble 1999  . Cardiac disease 1987  . 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 Heart attack ([place]) 1987  . History of angina 1965  . History of cardioversion   . Hyperlipidemia   . Hypertension 2007    Medications:  Scheduled:  . amLODipine  10 mg Oral Daily  . atorvastatin  80 mg Oral Daily  . digoxin  0.125 mg Oral Daily  . ezetimibe  10 mg Oral Daily  . furosemide  40 mg Intravenous Q12H  . lisinopril  40 mg Oral Daily  . loratadine  10 mg Oral Daily  . pregabalin  150 mg Oral Daily  . sodium chloride flush  3 mL Intravenous Q12H  . warfarin  5 mg Oral ONCE-1600  . Warfarin - Pharmacist Dosing Inpatient   Does not apply q1600    Assessment: Patient admitted x SOB w/ h/o CHF, permanent afib s/p ablation on chronic anticoagulation w/ warfarin PTA, w/ SOB s/t CHF exacerbation as evidenced by CXR showing pulmonary edema and cardiomegaly, BNP of 232, w/ elevated BP of [id number] - Q000111Q systolic. EKG showing RBBB and LPFB, current INR is 2.4 which is therapeutic.   Home Regimen: Warfarin 6mg :  Mon, Wed, Thurs, Sat, Sun                             Warfarin 7mg : Tues, Fri  DATE  INR DOSE 5/7  (PM) 2.4 - 5/8  -             Goal of Therapy:  INR 2-3 Monitor platelets by anticoagulation protocol: Yes   Plan:  Will give patient a dose of warfarin 6 mg po x 1 at 1600.  Will monitor daily CBC's and adjust warfarin dose per daily INRs  Pernell Dupre, PharmD, BCPS Clinical Pharmacist 04/26/2020 10:36 AM

## 2020-04-26 NOTE — H&P (Signed)
[facility] NAME: John [name]    MR#:  [id number]  DATE OF BIRTH:  [birth year]  DATE OF ADMISSION:  04/25/2020  PRIMARY CARE PHYSICIAN: Gayland Curry, MD   REQUESTING/REFERRING PHYSICIAN: Derrell Lolling, MD  CHIEF COMPLAINT:   Chief Complaint  Patient presents with  . Shortness of Breath    HISTORY OF PRESENT ILLNESS:  John [name]  is a 79 y.o. Caucasian male with a known history of atrial ablation on chronic anticoagulation with Coumadin, hypertension, dyslipidemia and coronary artery disease, who presented to the emergency room with the onset of worsening dyspnea with associated dry cough and occasional wheezing with minimal lower extremity edema.  He denied any fever or chills.  No chest pain or palpitations.  No nausea or vomiting or abdominal pain.  No dysuria, oliguria or hematuria or flank pain.  Upon presentation in the emergency room, blood pressure was 164/74 with a pulse of 122 and respiratory 28 with pulse ox of 83% on room air that was up to 96-99% on 6 L of O2 by nasal cannula.  Labs revealed a BUN of 24 and BNP was 232 with high-sensitivity troponin I of 28 and later 59.  CBC is unremarkable.  INR was 2.4 with PTT 25.3.  Influenza antigens and COVID-19 PCR were negative.  Chest x-ray showed pulmonary edema. 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  The patient was given 40 mg of IV Lasix.  He will be admitted to a progressive cardiac unit bed for further evaluation and management PAST MEDICAL HISTORY:   Past Medical History:  Diagnosis Date  . A-fib ([place])   . Bowel trouble 1999  . Cardiac disease 1987  . 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 Heart attack ([place]) 1987  . History of angina 1965  . History of cardioversion   . Hyperlipidemia   . Hypertension 2007    PAST SURGICAL HISTORY:   Past Surgical History:    Procedure Laterality Date  . CARDIAC CATHETERIZATION    . COLON RESECTION  1999  . COLON SURGERY    . COLONOSCOPY  05/30/2013   Dr Bary Castilla  . COLONOSCOPY WITH PROPOFOL N/A 08/16/2018   Procedure: COLONOSCOPY WITH PROPOFOL;  Surgeon: Robert Bellow, MD;  Location: [facility] ENDOSCOPY;  Service: Endoscopy;  Laterality: N/A;    SOCIAL HISTORY:   Social History   Tobacco Use  . Smoking status: Former Smoker    Years: 30.00  . Smokeless tobacco: Never Used  Substance Use Topics  . Alcohol use: Not Currently    Alcohol/week: 7.0 standard drinks    Types: 7 Glasses of wine per week    Comment: 1 glass red wine daily    FAMILY HISTORY:   Family History  Problem Relation Age of Onset  . Other Father        brain tumor    DRUG ALLERGIES:   Allergies  Allergen Reactions  . Procan Sr [Procainamide] Rash    REVIEW OF SYSTEMS:   ROS As per history of present illness. All pertinent systems were reviewed above. Constitutional,  HEENT, cardiovascular, respiratory, GI, GU, musculoskeletal, neuro, psychiatric, endocrine,  integumentary and hematologic systems were reviewed and are otherwise  negative/unremarkable except for positive findings mentioned above in the HPI.   MEDICATIONS AT HOME:   Prior to Admission medications   Medication Sig Start Date End Date Taking? Authorizing  Provider  amLODipine (NORVASC) 10 MG tablet Take 10 mg by mouth daily. 03/24/20  Yes [provider]  atorvastatin (LIPITOR) 80 MG tablet TAKE 1 TABLET (80 MG TOTAL) BY MOUTH ONCE DAILY. 03/20/18  Yes [provider]  Cetirizine HCl (ZYRTEC ALLERGY PO) Take 1 tablet by mouth daily.    Yes [provider]  digoxin (LANOXIN) 0.125 MG tablet Take 0.125 mg by mouth daily.   Yes [provider]  ezetimibe (ZETIA) 10 MG tablet Take by mouth. 02/01/17 04/25/20 Yes [provider]  lisinopril (PRINIVIL,ZESTRIL) 40 MG tablet Take 40 mg by mouth daily.    Yes [provider]  LYRICA 150 MG capsule Take 150 mg by mouth daily.  03/09/17  Yes [provider]  nitroGLYCERIN (NITROSTAT) 0.4 MG SL tablet Place 0.4 mg under the tongue every 5 (five) minutes as needed for chest pain.   Yes [provider]  warfarin (COUMADIN) 1 MG tablet Take 1 mg by mouth daily at 4 PM. Take as directed with 5 mg tablet for dose of 6mg -7 mg daily   Yes [provider]  warfarin (COUMADIN) 5 MG tablet Take 5 mg by mouth daily at 4 PM. Ad directed with 1 mg tablet for daily dose of 6-7 mg   Yes [provider]      VITAL SIGNS:  Blood pressure 129/67, pulse 84, temperature 98.5 F (36.9 C), temperature source Oral, resp. rate 17, height 5\' 8"  (1.727 m), weight 93.9 kg, SpO2 98 %.  PHYSICAL EXAMINATION:  Physical Exam  GENERAL:  79 y.o.-year-old Caucasian male patient lying in the bed with mild respiratory distress with conversational dyspnea. EYES: Pupils equal, round, reactive to light and accommodation. No scleral icterus. Extraocular muscles intact.  HEENT: Head atraumatic, normocephalic. Oropharynx and nasopharynx clear.  NECK:  Supple, no jugular venous distention. No thyroid enlargement, no tenderness.  LUNGS: Diminished bibasal breath sounds with bibasal rales.   CARDIOVASCULAR: Regular rate and rhythm, S1, S2 normal. No murmurs, rubs, or gallops.  ABDOMEN: Soft, nondistended, nontender. Bowel sounds present. No organomegaly or mass.  EXTREMITIES: Trace bilateral lower extremity pitting edema, with no cyanosis, or clubbing.  NEUROLOGIC: Cranial nerves II through XII are intact. Muscle strength 5/5 in all extremities. Sensation intact. Gait not checked.  PSYCHIATRIC: The patient is alert and oriented x 3.  Normal affect and good eye contact. SKIN: No obvious rash, lesion, or ulcer.   LABORATORY PANEL:   CBC Recent Labs  Lab 04/25/20 2325  WBC 9.2  HGB 13.4  HCT 42.8  PLT 183    ------------------------------------------------------------------------------------------------------------------  Chemistries  Recent Labs  Lab 04/25/20 2325  NA 141  K 3.9  CL 108  CO2 22  GLUCOSE 160*  BUN 24*  CREATININE 0.79  CALCIUM 9.3  MG 2.1   ------------------------------------------------------------------------------------------------------------------  Cardiac Enzymes No results for input(s): TROPONINI in the last 168 hours. ------------------------------------------------------------------------------------------------------------------  RADIOLOGY:  DG Chest 1 View  Result Date: 04/25/2020 CLINICAL DATA:  Shortness of breath for 3 days, fatigue EXAM: CHEST  1 VIEW COMPARISON:  04/23/2020 FINDINGS: Single frontal view of the chest demonstrates an enlarged cardiac silhouette. There is increased vascular congestion with diffuse interstitial prominence. Trace bilateral effusions. No pneumothorax. IMPRESSION: 1. Findings consistent with developing pulmonary edema. Electronically Signed   By: Randa Ngo M.D.   On: 04/25/2020 23:51      IMPRESSION AND PLAN:   1.  Acute CHF, likely diastolic. -The patient will be admitted to cardiac progressive unit  bed. -He will be diuresed with IV Lasix. -We will follow serial troponin I's. -We will obtain a 2D echo. -Cardiology consult will be obtained. -Dr. Nehemiah Massed was notified about the patient.  2.  Uncontrolled hypertension. -We will continue his antihypertensives -She will be placed on as needed IV labetalol.  3.  Dyslipidemia. -Statin therapy will be resumed.  4.  Atrial fibrillation. -We will continue digoxin and Coumadin.  5.  Gout. -We will continue.  6.  DVT prophylaxis. -Subcutaneous Lovenox.   All the records are reviewed and case discussed with ED provider. The plan of care was discussed in details with the patient (and family). I answered all questions. The patient agreed to proceed with the  above mentioned plan. Further management will depend upon hospital course.   CODE STATUS: Full code  Status is: Inpatient  Remains inpatient appropriate because:Ongoing diagnostic testing needed not appropriate for outpatient work up, Unsafe d/c plan, IV treatments appropriate due to intensity of illness or inability to take PO and Inpatient level of care appropriate due to severity of illness   Dispo: The patient is from: Home              Anticipated d/c is to: Home              Anticipated d/c date is: 2 days              Patient currently is not medically stable to d/c.   TOTAL TIME TAKING CARE OF THIS PATIENT: 55 minutes.    Christel Mormon M.D on 04/26/2020 at 3:53 AM  [facility]   From 7 PM-7 AM, contact night-coverage [email or web address]  CC: Primary care physician; Gayland Curry, MD   Note: This dictation was prepared with Dragon dictation along with smaller phrase technology. Any transcriptional errors that result from this process are unintentional.

## 2020-04-26 NOTE — ED Notes (Signed)
Pt reports marked improvement in [facility] after Lasix administration. Pt states he does not use home O2. Pt has been noted to have consistent O2 sats of 99-100% since this RN assumed care at 11pm. O2 has been reduced from 6L to 4L at this time to assess pt's ability to maintain adequate O2 sats with a lower concentration of supplemental O2.

## 2020-04-26 NOTE — ED Notes (Signed)
Restarted pt's BP cuff, provided pt w/ TV remote.

## 2020-04-26 NOTE — Progress Notes (Signed)
ANTICOAGULATION CONSULT NOTE - Initial Consult  Pharmacy Consult for warfarin Indication: atrial fibrillation  Allergies  Allergen Reactions  . Procan Sr [Procainamide] Rash    Patient Measurements: Height: 5\' 8"  (172.7 cm) Weight: 93.9 kg (207 lb) IBW/kg (Calculated) : 68.4  Vital Signs: Temp: 98.5 F (36.9 C) (05/07 2312) Temp Source: Oral (05/07 2312) BP: 143/61 (05/08 0500) Pulse Rate: 71 (05/08 0500)  Labs: Recent Labs    04/25/20 2325 04/26/20 0149  HGB 13.4  --   HCT 42.8  --   PLT 183  --   LABPROT 25.3*  --   INR 2.4*  --   CREATININE 0.79  --   TROPONINIHS 28* 59*    Estimated Creatinine Clearance: 80.5 mL/min (by C-G formula based on SCr of 0.79 mg/dL).   Medical History: Past Medical History:  Diagnosis Date  . A-fib ([place])   . Bowel trouble 1999  . Cardiac disease 1987  . 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 Heart attack ([place]) 1987  . History of angina 1965  . History of cardioversion   . Hyperlipidemia   . Hypertension 2007    Medications:  Scheduled:  . amLODipine  10 mg Oral Daily  . atorvastatin  80 mg Oral Daily  . cetirizine  10 mg Oral Daily  . digoxin  0.125 mg Oral Daily  . ezetimibe  10 mg Oral Daily  . furosemide  40 mg Intravenous Q12H  . lisinopril  40 mg Oral Daily  . pregabalin  150 mg Oral Daily  . sodium chloride flush  3 mL Intravenous Q12H  . warfarin  5 mg Oral ONCE-1600  . Warfarin - Pharmacist Dosing Inpatient   Does not apply q1600    Assessment: Patient admitted x SOB w/ h/o CHF, permanent afib s/p ablation on chronic anticoagulation w/ warfarin PTA, w/ SOB s/t CHF exacerbation as evidenced by CXR showing pulmonary edema and cardiomegaly, BNP of 232, w/ elevated BP of XX123456 - Q000111Q systolic. EKG showing RBBB and LPFB, current INR is 2.4 which is therapeutic. Patient's warfarin PTA is not exactly clear, takes multiple tablets to make is daily dose.   Goal of Therapy:  INR 2-3 Monitor platelets by  anticoagulation protocol: Yes   Plan:  Will give patient a dose of warfarin 5 mg po x 1 at 1600. One time dose considering his PTA dose isn't clear at this point. Will monitor daily CBC's and adjust warfarin dose per daily INRs  Tobie Lords, PharmD, BCPS Clinical Pharmacist 04/26/2020,5:05 AM

## 2020-04-26 NOTE — ED Notes (Signed)
Dr Joan Mayans made aware at this time of pt's elevated Troponin level as reported by lab. Troponin 59ng/L

## 2020-04-26 NOTE — Progress Notes (Signed)
PROGRESS NOTE  John [name] [id number] DOB: 05/26/1941 DOA: 04/25/2020 PCP: Gayland Curry, MD  Brief History   Jammes Rizer is a 79 y.o. caucasian male with a known history of atrial ablation on chronic anticoagulation with Coumadin, hypertension, dyslipidemia and coronary artery disease, who presented to the emergency room with the onset of worsening dyspnea with associated dry cough and occasional wheezing with minimal lower extremity edema.  He denied any fever or chills.  No chest pain or palpitations.  No nausea or vomiting or abdominal pain.  No dysuria, oliguria or hematuria or flank pain.  Upon presentation in the emergency room, blood pressure was 164/74 with a pulse of 122 and respiratory 28 with pulse ox of 83% on room air that was up to 96-99% on 6 L of O2 by nasal cannula.  Labs revealed a BUN of 24 and BNP was 232 with high-sensitivity troponin I of 28 and later 59.  CBC is unremarkable.  INR was 2.4 with PTT 25.3.  Influenza antigens and COVID-19 PCR were negative.  Chest x-ray showed pulmonary edema. 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  The patient was given 40 mg of IV Lasix.  He will be admitted to a progressive cardiac unit bed for further evaluation and management  Consultants  . Cardiology  Procedures  . None  Antibiotics   Anti-infectives (From admission, onward)   None    .  Subjective  The patient is resting comfortably. He states that he is feeling a little better. No new complaints.  Objective   Vitals:  Vitals:   04/26/20 1100 04/26/20 1200  BP: (!) 137/50 (!) 153/60  Pulse: (!) 58 71  Resp: 17 19  Temp:  97.6 F (36.4 C)  SpO2: 98% 100%    Exam:  Constitutional:  . The patient is awake, alert, and oriented x 3. No acute distress. Respiratory:  . No increased work of breathing. . No wheezes or rhonchi . Positive for rales at bases  bilaterally . No tactile fremitus . Diminished breath sounds bilaterally Cardiovascular:  . Regular rate and rhythm . No murmurs, ectopy, or gallups. . No lateral PMI. No thrills. Abdomen:  . Abdomen is soft, non-tender, non-distended . No hernias, masses, or organomegaly . Normoactive bowel sounds.  Musculoskeletal:  . No cyanosis or clubbing . +2 pitting edema of lower extremities bilaterally Skin:  . No rashes, lesions, ulcers . palpation of skin: no induration or nodules Neurologic:  . CN 2-12 intact . Sensation all 4 extremities intact Psychiatric:  . Mental status o Mood, affect appropriate o Orientation to person, place, time  . judgment and insight appear intact  I have personally reviewed the following:   Today's Data  . Vitals, BMP, Troponin, CBC, INR.  Imaging  . CXR: pulmonary edema  Cardiology Data  . EKG: Atrial fibrillation, rate of 90.  Scheduled Meds: . amLODipine  10 mg Oral Daily  . atorvastatin  80 mg Oral Daily  . digoxin  0.125 mg Oral Daily  . ezetimibe  10 mg Oral Daily  . furosemide  40 mg Intravenous Q12H  . lisinopril  40 mg Oral Daily  . loratadine  10 mg Oral Daily  . pregabalin  150 mg Oral Daily  . sodium chloride flush  3 mL Intravenous Q12H  . warfarin  6 mg Oral ONCE-1600  . Warfarin - Pharmacist Dosing Inpatient   Does not apply [id number]  Continuous Infusions: . sodium chloride      Active Problems:   Acute CHF (congestive heart failure) (HCC)   LOS: 0 days   A & P   Problem  Acute Exacerbation of Chf (Congestive Heart Failure) (Hcc)  Uncontrolled Hypertension  Dyslipidemia  Af (Paroxysmal Atrial Fibrillation) (Hcc)  Gout   Acute exacerbation of CHF: The patient has been admitted to a progressive care unit. @D  echocardiogram has been performed. Report is pending. Cariology has been consulted. Dr. Nehemiah Massed is aware. He has been continued on digoxin and lisinopril.  Elevated troponins: These are being followed. They  are rising. I appreciate Dr. Alveria Apley assistance.   Uncontrolled hypertension: The patient has been continued on norvasc and lisinopril. He also has been placed on IV labetalol. Improved BP.  Dyslipidemia: Statin therapy will be resumed.  Atrial fibrillation: We will continue digoxin and Coumadin. Monitor on telemetry.  Gout: Monitor for flare  DVT Prophylaxis: Continue coumadin CODE STATUS: Full code Family Communication: None available Disposition: The patient is from home. It is anticipated that he will be discharged to home. Barriers to discharge include acute exacerbation of CHF and elevated troponins. Discharge will be pending cardiological evaluation and treatment.  Dispo: The patient is from: Home  Anticipated d/c is to: Home  Anticipated d/c date is: 2 days  Patient currently is not medically stable to d/c.  Hong Moring, DO [facility] Direct contact: see [email or web address]  7PM-7AM contact night coverage as above 04/26/2020, 2:03 PM  LOS: 0 days

## 2020-04-26 NOTE — ED Notes (Signed)
Pt has breakfast tray at bedside.  

## 2020-04-27 LAB — BASIC METABOLIC PANEL
Anion gap: 8 (ref 5–15)
Anion gap: 9 (ref 5–15)
BUN: 24 mg/dL — ABNORMAL HIGH (ref 8–23)
BUN: 25 mg/dL — ABNORMAL HIGH (ref 8–23)
CO2: 28 mmol/L (ref 22–32)
CO2: 29 mmol/L (ref 22–32)
Calcium: 8.8 mg/dL — ABNORMAL LOW (ref 8.9–10.3)
Calcium: 9 mg/dL (ref 8.9–10.3)
Chloride: 106 mmol/L (ref 98–111)
Chloride: 104 mmol/L (ref 98–111)
Creatinine, Ser: 0.85 mg/dL (ref 0.61–1.24)
Creatinine, Ser: 0.95 mg/dL (ref 0.61–1.24)
GFR calc Af Amer: >60 mL/min (ref >60)
GFR calc Af Amer: >60 mL/min (ref >60)
GFR calc non Af Amer: >60 mL/min (ref >60)
GFR calc non Af Amer: >60 mL/min (ref >60)
Glucose, Bld: 123 mg/dL — ABNORMAL HIGH (ref 70–99)
Glucose, Bld: 132 mg/dL — ABNORMAL HIGH (ref 70–99)
Potassium: 3.4 mmol/L — ABNORMAL LOW (ref 3.5–5.1)
Potassium: 3.8 mmol/L (ref 3.5–5.1)
Sodium: 142 mmol/L (ref 135–145)
Sodium: 142 mmol/L (ref 135–145)

## 2020-04-27 LAB — MAGNESIUM: Magnesium: 1.9 mg/dL (ref 1.7–2.4)

## 2020-04-27 LAB — CBC
HCT: 37.2 % — ABNORMAL LOW (ref 39.0–52.0)
Hemoglobin: 11.9 g/dL — ABNORMAL LOW (ref 13.0–17.0)
MCH: 29.8 pg (ref 26.0–34.0)
MCHC: 32 g/dL (ref 30.0–36.0)
MCV: 93.2 fL (ref 80.0–100.0)
Platelets: 175 10*3/uL (ref 150–400)
RBC: 3.99 MIL/uL — ABNORMAL LOW (ref 4.22–5.81)
RDW: 15.7 % — ABNORMAL HIGH (ref 11.5–15.5)
WBC: 8.1 10*3/uL (ref 4.0–10.5)
nRBC: 0 % (ref 0.0–0.2)

## 2020-04-27 LAB — PROTIME-INR
INR: 2.4 — ABNORMAL HIGH (ref 0.8–1.2)
Prothrombin Time: 25.6 seconds — ABNORMAL HIGH (ref 11.4–15.2)

## 2020-04-27 LAB — ECHOCARDIOGRAM COMPLETE
Height: 68 in
Weight: 3312 oz

## 2020-04-27 MED ORDER — WARFARIN SODIUM 6 MG PO TABS
6.0000 mg | ORAL_TABLET | Freq: Once | ORAL | Status: AC
  Administered 2020-04-27: 16:00:00 6 mg via ORAL
  Filled 2020-04-27: qty 1

## 2020-04-27 MED ORDER — CARVEDILOL 3.125 MG PO TABS
3.1250 mg | ORAL_TABLET | Freq: Two times a day (BID) | ORAL | Status: DC
  Administered 2020-04-27 – 2020-04-29 (×5): 3.125 mg via ORAL
  Filled 2020-04-27 (×5): qty 1

## 2020-04-27 MED ORDER — POTASSIUM CHLORIDE CRYS ER 20 MEQ PO TBCR
40.0000 meq | EXTENDED_RELEASE_TABLET | Freq: Once | ORAL | Status: AC
  Administered 2020-04-27: 13:00:00 40 meq via ORAL
  Filled 2020-04-27: qty 2

## 2020-04-27 NOTE — Progress Notes (Signed)
[facility] Encounter Note  Patient: John [name] / Admit Date: 04/25/2020 / Date of Encounter: 04/27/2020, 6:33 AM   Subjective: Patient has had some overall improvements of congestive heart failure and shortness of breath.  Patient has still mild pulmonary edema but no apparent significant hypoxia.  Troponin level has been elevated most consistent with demand ischemia rather than acute coronary syndrome.  Telemetry shows atrial fibrillation with more controlled heart rate Echocardiogram showing global LV systolic dysfunction with ejection fraction of 35% with moderate dilation moderate to severe mitral regurgitation and mild aortic stenosis contributing to above  Review of Systems: Positive for: Shortness of breath Negative for: Vision change, hearing change, syncope, dizziness, nausea, vomiting,diarrhea, bloody stool, stomach pain, cough, congestion, diaphoresis, urinary frequency, urinary pain,skin lesions, skin rashes Others previously listed  Objective: Telemetry: Atrial fibrillation with controlled ventricular rate Physical Exam: Blood pressure (!) 127/57, pulse (!) 56, temperature 98.2 F (36.8 C), temperature source Oral, resp. rate 20, height 5\' 8"  (1.727 m), weight 91.1 kg, SpO2 94 %. Body mass index is 30.53 kg/m. General: Well developed, well nourished, in no acute distress. Head: Normocephalic, atraumatic, sclera non-icteric, no xanthomas, nares are without discharge. Neck: No apparent masses Lungs: Normal respirations with no wheezes, no rhonchi, no rales , no crackles   Heart: Irregular rate and rhythm, normal S1 S2, apical and right upper sternal border murmur, no rub, no gallop, PMI is normal size and placement, carotid upstroke normal without bruit, jugular venous pressure normal Abdomen: Soft, non-tender, non-distended with normoactive bowel sounds. No hepatosplenomegaly. Abdominal aorta is normal size without bruit Extremities: Trace to 1+ edema,  no clubbing, no cyanosis, no ulcers,  Peripheral: 2+ radial, 2+ femoral, 2+ dorsal pedal pulses Neuro: Alert and oriented. Moves all extremities spontaneously. Psych:  Responds to questions appropriately with a normal affect.   Intake/Output Summary (Last 24 hours) at 04/27/2020 0633 Last data filed at 04/27/2020 0452 Gross per 24 hour  Intake 240 ml  Output 1625 ml  Net -1385 ml    Inpatient Medications:  . amLODipine  10 mg Oral Daily  . atorvastatin  80 mg Oral Daily  . digoxin  0.125 mg Oral Daily  . ezetimibe  10 mg Oral Daily  . furosemide  40 mg Intravenous Q12H  . lisinopril  40 mg Oral Daily  . loratadine  10 mg Oral Daily  . pregabalin  150 mg Oral Daily  . sodium chloride flush  3 mL Intravenous Q12H  . Warfarin - Pharmacist Dosing Inpatient   Does not apply q1600   Infusions:  . sodium chloride      Labs: Recent Labs    04/25/20 2325 04/27/20 0421  NA 141 142  K 3.9 3.4*  CL 108 106  CO2 22 28  GLUCOSE 160* 123*  BUN 24* 24*  CREATININE 0.79 0.85  CALCIUM 9.3 8.8*  MG 2.1  --    No results for input(s): AST, ALT, ALKPHOS, BILITOT, PROT, ALBUMIN in the last 72 hours. Recent Labs    04/25/20 2325 04/27/20 0421  WBC 9.2 8.1  HGB 13.4 11.9*  HCT 42.8 37.2*  MCV 95.7 93.2  PLT 183 175   No results for input(s): CKTOTAL, CKMB, TROPONINI in the last 72 hours. Invalid input(s): POCBNP No results for input(s): HGBA1C in the last 72 hours.   Weights: Filed Weights   04/25/20 2313 04/27/20 0449  Weight: 93.9 kg 91.1 kg     Radiology/Studies:  DG Chest 1 View  Result Date: 04/25/2020 CLINICAL DATA:  Shortness of breath for 3 days, fatigue EXAM: CHEST  1 VIEW COMPARISON:  04/23/2020 FINDINGS: Single frontal view of the chest demonstrates an enlarged cardiac silhouette. There is increased vascular congestion with diffuse interstitial prominence. Trace bilateral effusions. No pneumothorax. IMPRESSION: 1. Findings consistent with developing pulmonary  edema. Electronically Signed   By: Randa Ngo M.D.   On: 04/25/2020 23:51   DG Chest 2 View  Result Date: 04/23/2020 CLINICAL DATA:  Dyspnea and short of breath EXAM: CHEST - 2 VIEW COMPARISON:  None. FINDINGS: Small right-sided pleural effusion. Cardiomegaly with aortic atherosclerosis. No pneumothorax. IMPRESSION: 1. Mild cardiomegaly with small right-sided pleural effusion. Electronically Signed   By: Donavan Foil M.D.   On: 04/23/2020 23:14   ECHOCARDIOGRAM COMPLETE  Result Date: 04/27/2020    ECHOCARDIOGRAM REPORT   Patient Name:   John [name] Date of Exam: 04/26/2020 Medical Rec #:  [id number]       Height:       68.0 in Accession #:    [id number]      Weight:       207.0 lb Date of Birth:  11-20-1941        BSA:          2.074 m Patient Age:    79 years        BP:           Not listed in chart/Not listed in                                               chart mmHg Patient Gender: M               HR:           93 bpm. Exam Location:  [facility] Procedure: 2D Echo, Cardiac Doppler and Color Doppler Indications:     CHF- acute diastolic [id number]  History:         Patient has no prior history of Echocardiogram examinations.                  Arrythmias:Atrial Fibrillation; Risk Factors:Hypertension.                  Hearst attack.  Sonographer:     Sherrie Sport RDCS (AE) Referring Phys:  [id number] [facility] Diagnosing Phys: Serafina Royals MD IMPRESSIONS  1. Left ventricular ejection fraction, by estimation, is 35 to 40%. The left ventricle has moderately decreased function. The left ventricle demonstrates global hypokinesis. The left ventricular internal cavity size was moderately dilated. Left ventricular diastolic function could not be evaluated.  2. Right ventricular systolic function is normal. The right ventricular size is normal. There is moderately elevated pulmonary artery systolic pressure.  3. Left atrial size was moderately dilated.  4. The mitral valve is grossly normal. Moderate to severe mitral valve  regurgitation.  5. The aortic valve is normal in structure. Aortic valve regurgitation is trivial. Mild aortic valve stenosis. FINDINGS  Left Ventricle: Left ventricular ejection fraction, by estimation, is 35 to 40%. The left ventricle has moderately decreased function. The left ventricle demonstrates global hypokinesis. The left ventricular internal cavity size was moderately dilated. There is borderline left ventricular hypertrophy. Left ventricular diastolic function could not be evaluated. Right Ventricle: The right ventricular size is normal. No increase in right ventricular wall  thickness. Right ventricular systolic function is normal. There is moderately elevated pulmonary artery systolic pressure. The tricuspid regurgitant velocity is 3.09 m/s, and with an assumed right atrial pressure of 10 mmHg, the estimated right ventricular systolic pressure is [id number] mmHg. Left Atrium: Left atrial size was moderately dilated. Right Atrium: Right atrial size was normal in size. Pericardium: There is no evidence of pericardial effusion. Mitral Valve: The mitral valve is grossly normal. Moderate to severe mitral valve regurgitation. Tricuspid Valve: The tricuspid valve is normal in structure. Tricuspid valve regurgitation is mild. Aortic Valve: The aortic valve is normal in structure. Aortic valve regurgitation is trivial. Aortic regurgitation PHT measures 420 msec. Mild aortic stenosis is present. Aortic valve mean gradient measures 11.0 mmHg. Aortic valve peak gradient measures 19.7 mmHg. Aortic valve area, by VTI measures 0.95 cm. Pulmonic Valve: The pulmonic valve was normal in structure. Pulmonic valve regurgitation is trivial. Aorta: The aortic root and ascending aorta are structurally normal, with no evidence of dilitation. IAS/Shunts: No atrial level shunt detected by color flow Doppler.  LEFT VENTRICLE PLAX 2D LVIDd:         6.29 cm LVIDs:         5.27 cm LV PW:         1.26 cm LV IVS:        1.01 cm LVOT diam:      2.00 cm LV SV:         40 LV SV Index:   19 LVOT Area:     3.14 cm  LV Volumes (MOD) LV vol d, MOD A2C: 185.0 ml LV vol d, MOD A4C: 151.0 ml LV vol s, MOD A2C: 115.0 ml LV vol s, MOD A4C: 70.3 ml LV SV MOD A2C:     70.0 ml LV SV MOD A4C:     151.0 ml LV SV MOD BP:      87.9 ml RIGHT VENTRICLE RV Basal diam:  4.44 cm RV S prime:     12.90 cm/s TAPSE (M-mode): 4.4 cm LEFT ATRIUM              Index       RIGHT ATRIUM           Index LA diam:        5.50 cm  2.65 cm/m  RA Area:     38.10 cm LA Vol (A2C):   134.0 ml 64.61 ml/m RA Volume:   163.00 ml 78.60 ml/m LA Vol (A4C):   88.2 ml  42.53 ml/m LA Biplane Vol: 108.0 ml 52.08 ml/m  AORTIC VALVE                    PULMONIC VALVE AV Area (Vmax):    0.94 cm     PV Vmax:        0.98 m/s AV Area (Vmean):   0.89 cm     PV Peak grad:   3.9 mmHg AV Area (VTI):     0.95 cm     RVOT Peak grad: 3 mmHg AV Vmax:           222.00 cm/s AV Vmean:          154.667 cm/s AV VTI:            0.418 m AV Peak Grad:      19.7 mmHg AV Mean Grad:      11.0 mmHg LVOT Vmax:         66.60 cm/s LVOT Vmean:  44.000 cm/s LVOT VTI:          0.126 m LVOT/AV VTI ratio: 0.30 AI PHT:            420 msec  AORTA Ao Root diam: 3.30 cm MITRAL VALVE                TRICUSPID VALVE MV Area (PHT): 6.17 cm     TR Peak grad:   38.2 mmHg MV Decel Time: 123 msec     TR Vmax:        309.00 cm/s MV E velocity: 129.00 cm/s                             SHUNTS                             Systemic VTI:  0.13 m                             Systemic Diam: 2.00 cm Serafina Royals MD Electronically signed by Serafina Royals MD Signature Date/Time: 04/27/2020/6:04:31 AM    Final      Assessment and Recommendation  79 y.o. male with acute systolic dysfunction congestive heart failure multifactorial in nature including LV systolic dysfunction moderate to severe mitral regurgitation and atrial fibrillation slightly improved at this time after intravenous Lasix without evidence of myocardial infarction but elevated  troponin consistent with demand ischemia 1.  Continue intravenous furosemide for continued reduction of pulmonary edema hypoxia and acute systolic dysfunction heart failure 2.  Continue medication management for heart rate control including digoxin as able but would consider change to beta-blocker carvedilol for further risk reduction in cardiomyopathy treatment 3.  Continue ACE inhibitor for cardiomyopathy and congestive heart failure 4.  High intensity cholesterol therapy for history of cardiovascular disease 5.  Begin ambulation and follow for improvements of symptoms and further adjustments  Signed, Serafina Royals M.D. FACC

## 2020-04-27 NOTE — Evaluation (Signed)
Occupational Therapy Evaluation Patient Details Name: John [name] MRN: [id number] DOB: 06-Jul-1941 Today's Date: 04/27/2020    History of Present Illness Esgar Waymire is a 79 y.o. caucasian male with a known history of atrial ablation on chronic anticoagulation with Coumadin, hypertension, dyslipidemia and coronary artery disease, who presented to the emergency room with the onset of worsening dyspnea with associated dry cough and occasional wheezing with minimal lower extremity edema.   Clinical Impression   Mr Viesca was seen for OT evaluation this date. Prior to hospital admission, pt was Independent c mobility and I/ADLs c no AD including driving and yardwork. Pt lives c wife and son lives nearby. Pt presents to acute OT demonstrating impaired ADL performance and functional mobility 2/2 decreased activity tolerance. Pt currently requires SUPERVISION hand washing standing sinkside c VCs for PLB. MOD I don/doff B socks seated EOB. Per RN request pt trialed on RA for in room functional mobility. Seated EOB: SpO2 95% on RA. Standing EOB: pt desat to 89% on RA, resolved to  94% c PLB. After standing task/ in room mobility ~ 3 mins: 87% on RA, required seated rest break and 2L [place] resolved to 97%. Pt would benefit from skilled OT to address noted impairments and functional limitations (see below for any additional details) in order to maximize safety and independence while minimizing falls risk and caregiver burden. Upon hospital discharge, recommend HHOT to maximize pt safety and return to functional independence during meaningful occupations of daily life.    Follow Up Recommendations  Home health OT    Equipment Recommendations  3 in 1 bedside commode    Recommendations for Other Services       Precautions / Restrictions Precautions Precautions: None Restrictions Weight Bearing Restrictions: No      Mobility Bed Mobility Overal bed mobility: Modified Independent              General bed mobility comments: Pt comes easily to EOB c HOB ~30*. Pt demonstrates good trunk control sit>sup on flat bed.   Transfers Overall transfer level: Modified independent               General transfer comment: Sit<>stand at EOB c no AD     Balance Overall balance assessment: Modified Independent                                         ADL either performed or assessed with clinical judgement   ADL Overall ADL's : Needs assistance/impaired                                       General ADL Comments: Supervision + O2 grossly for standing/OOB ADL tasks. SUP hand washing standing sinkside and toilet t/f. MOD I don/doff B socks seated EOB     Vision Baseline Vision/History: Wears glasses Wears Glasses: At all times       Perception     Praxis      Pertinent Vitals/Pain Pain Assessment: No/denies pain     Hand Dominance Right   Extremity/Trunk Assessment Upper Extremity Assessment Upper Extremity Assessment: Overall WFL for tasks assessed   Lower Extremity Assessment Lower Extremity Assessment: Overall WFL for tasks assessed       Communication Communication Communication: No difficulties   Cognition Arousal/Alertness: Awake/alert Behavior During Therapy:  WFL for tasks assessed/performed Overall Cognitive Status: Within Functional Limits for tasks assessed                                     General Comments  Reclined in bed: SpO2 96% on 2L [place], 90% on RA, 95% on 1L [place].  Seated EOB: SpO2 95% on RA.  Standing EOB: 94% on RA.  After in room mobility ~ 3 mins: 87% on RA    Exercises Exercises: Other exercises Other Exercises Other Exercises: Pt and caregivers educated re: energy conservation, falls prevention, OT role, DME recs, d/c recs, IS (provided) frequency and use Other Exercises: Simulated toilet t/f and UBD. LBD. hand washing, sitting/standing balance/tolerance, bed mobility, sup<>sit,  sit<>stand   Shoulder Instructions      Home Living Family/patient expects to be discharged to:: Private residence Living Arrangements: Spouse/significant other Available Help at Discharge: Family;Available 24 hours/day Type of Home: House Home Access: Stairs to enter [employer] of Steps: 3 Entrance Stairs-Rails: Right Home Layout: One level     Bathroom Shower/Tub: Teacher, early years/pre: Handicapped height     Home Equipment: Grab bars - tub/shower          Prior Functioning/Environment Level of Independence: Independent        Comments: Independent c I/ADLs and mobility c no AD. Pt drives and engages in outdoor yardwork/handiwork around house        OT Problem List: Decreased activity tolerance      OT Treatment/Interventions: Self-care/ADL training;Therapeutic exercise;Neuromuscular education;Energy conservation;DME and/or AE instruction;Therapeutic activities;Visual/perceptual remediation/compensation;Patient/family education;Balance training    OT Goals(Current goals can be found in the care plan section) Acute Rehab OT Goals Patient Stated Goal: To return to PLOF OT Goal Formulation: With patient/family Time For Goal Achievement: 05/11/20 Potential to Achieve Goals: Good ADL Goals Pt Will Transfer to Toilet: Independently;ambulating;regular height toilet Pt Will Perform Toileting - Clothing Manipulation and hygiene: Independently;sit to/from stand Additional ADL Goal #1: Pt will verbalize plan to implement x3 energy conservation strategies  OT Frequency: Min 1X/week   Barriers to D/C: Inaccessible home environment          Co-evaluation              AM-PAC OT "6 Clicks" Daily Activity     Outcome Measure Help from another person eating meals?: None Help from another person taking care of personal grooming?: None Help from another person toileting, which includes using toliet, bedpan, or urinal?: A Little Help from  another person bathing (including washing, rinsing, drying)?: A Little Help from another person to put on and taking off regular upper body clothing?: None Help from another person to put on and taking off regular lower body clothing?: None 6 Click Score: 22   End of Session Equipment Utilized During Treatment: Oxygen(2L [place]) Nurse Communication: Other (comment)(O2 status )  Activity Tolerance: Patient tolerated treatment well Patient left: in bed;with call bell/phone within reach;with bed alarm set;with family/visitor present  OT Visit Diagnosis: Unsteadiness on feet (R26.81);Other abnormalities of gait and mobility (R26.89)                Time: [id number] OT Time Calculation (min): 35 min Charges:  OT General Charges $OT Visit: 1 Visit OT Evaluation $OT Eval Moderate Complexity: 1 Mod OT Treatments $Self Care/Home Management : 23-37 mins  Dessie Coma, M.S. OTR/L  04/27/20, 3:20 PM

## 2020-04-27 NOTE — Progress Notes (Signed)
PROGRESS NOTE  John [name] [id number] DOB: [birth year] DOA: 04/25/2020 PCP: Gayland Curry, MD  Brief History   John [name] is a 79 y.o. caucasian male with a known history of atrial ablation on chronic anticoagulation with [name], John [name], John [name], who presented to the emergency room with the onset of worsening dyspnea with associated dry cough and occasional wheezing with minimal lower extremity edema.  He denied any fever or chills.  No chest pain or palpitations.  No nausea or vomiting or abdominal pain.  No dysuria, oliguria or hematuria or flank pain.  Upon presentation in the emergency room, blood pressure was 164/74 with a pulse of 122 and respiratory 28 with pulse ox of 83% on room air that was up to 96-99% on 6 L of O2 by nasal cannula.  Labs revealed a BUN of 24 and BNP was 232 with high-sensitivity troponin I of 28 and later 59.  CBC is unremarkable.  INR was 2.4 with PTT 25.3.  Influenza antigens and COVID-19 PCR were negative.  Chest x-ray showed pulmonary edema. 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  The patient was given 40 mg of IV Lasix.  He will be admitted to a progressive cardiac unit bed for further evaluation and management. Cardiology has been consulted. Recommendation is to continue IV diuresis. He feels that there is no concern for actual ACS, but instead due to simple demand ischemia. He recommends change to beta blocker from digoxin for addition of risk reduction to rate control. ACE Inhibitor was continued as was high intensity cholesterol therapy.   Consultants  . Cardiology  Procedures  . None  Antibiotics   Anti-infectives (From admission, onward)   None     Subjective  The patient is resting comfortably. He states that he is feeling a little better. No new complaints.  Objective   Vitals:  Vitals:    04/27/20 0718 04/27/20 1122  BP: (!) 142/63 122/65  Pulse: 63 (!) 58  Resp: 18 18  Temp: 98 F (36.7 C) 98.1 F (36.7 C)  SpO2: 92% 93%    Exam:  Constitutional:  . The patient is awake, alert, and oriented x 3. No acute distress. Respiratory:  . No increased work of breathing. . No wheezes or rhonchi . Diminishing rales at bases bilaterally . No tactile fremitus . Diminished breath sounds bilaterally Cardiovascular:  . Regular rate and rhythm . No murmurs, ectopy, or gallups. . No lateral PMI. No thrills. Abdomen:  . Abdomen is soft, non-tender, non-distended . No hernias, masses, or organomegaly . Normoactive bowel sounds.  Musculoskeletal:  . No cyanosis or clubbing . +1 pitting edema of lower extremities bilaterally Skin:  . No rashes, lesions, ulcers . palpation of skin: no induration or nodules Neurologic:  . CN 2-12 intact . Sensation all 4 extremities intact Psychiatric:  . Mental status o Mood, affect appropriate o Orientation to person, place, time  . judgment and insight appear intact  I have personally reviewed the following:   Today's Data  . Vitals, BMP, CBC, INR.  Imaging  . CXR: pulmonary edema  Cardiology Data  . EKG: Atrial fibrillation, rate of 90.  Scheduled Meds: . amLODipine  10 mg Oral Daily  . atorvastatin  80 mg Oral Daily  . carvedilol  3.125 mg Oral BID WC  . ezetimibe  10 mg Oral Daily  . furosemide  40 mg Intravenous Q12H  .  lisinopril  40 mg Oral Daily  . loratadine  10 mg Oral Daily  . pregabalin  150 mg Oral Daily  . sodium chloride flush  3 mL Intravenous Q12H  . warfarin  6 mg Oral ONCE-1600  . Warfarin - Pharmacist Dosing Inpatient   Does not apply q1600   Continuous Infusions: . sodium chloride      Principal Problem:   Acute exacerbation of CHF (congestive heart failure) (HCC) Active Problems:   Acute CHF (congestive heart failure) (HCC)   Uncontrolled John [name]   John   AF (paroxysmal atrial  fibrillation) ([place])   Gout   LOS: 1 day   A & P   No problems updated. Acute exacerbation of CHF: The patient has been admitted to a progressive care unit. @D  echocardiogram has been performed. Report is pending. Cariology has been consulted. Recommendation is to continue IV diuresis. He feels that there is no concern for actual ACS, but instead due to simple demand ischemia. He recommends change to beta blocker from digoxin for addition of risk reduction to rate control. ACE Inhibitor was continued as was high intensity cholesterol therapy.   Elevated troponins: These are being followed. They are rising. I appreciate Dr. Alveria Apley assistance.   Uncontrolled John [name]: The patient has been continued on norvasc and lisinopril. He also has been placed on IV labetalol. Improved BP.  John: Statin therapy will be resumed.  Atrial fibrillation: We will continue digoxin and [name]. Monitor on telemetry.  Gout: Monitor for flare  DVT Prophylaxis: Continue [name] CODE STATUS: Full code Family Communication: None available Disposition: The patient is from home. It is anticipated that he will be discharged to home. Barriers to discharge include acute exacerbation of CHF and elevated troponins. Discharge will be pending cardiological evaluation and treatment as well as PT/OT evaluation and recommendations.  Aubria Vanecek, DO [facility] Direct contact: see [email or web address]  7PM-7AM contact night coverage as above 04/27/2020, 1:10 PM  LOS: 0 days

## 2020-04-27 NOTE — Progress Notes (Signed)
SATURATION QUALIFICATIONS: (This note is used to comply with regulatory documentation for home oxygen)  Patient Saturations on Room Air at Rest = 94%  Patient Saturations on Room Air while Ambulating = 87%  Patient Saturations on **2* Liters of oxygen while Ambulating = 98%  Please briefly explain why patient needs home oxygen: 

## 2020-04-27 NOTE — Progress Notes (Signed)
ANTICOAGULATION CONSULT NOTE - Initial Consult  Pharmacy Consult for warfarin Indication: atrial fibrillation  Allergies  Allergen Reactions  . Procan Sr [Procainamide] Rash    Patient Measurements: Height: 5\' 8"  (172.7 cm) Weight: 91.1 kg (200 lb 12.8 oz) IBW/kg (Calculated) : 68.4  Vital Signs: Temp: 98 F (36.7 C) (05/09 0718) Temp Source: Oral (05/09 0718) BP: 142/63 (05/09 0718) Pulse Rate: 63 (05/09 0718)  Labs: Recent Labs    04/25/20 2325 04/26/20 0149 04/27/20 0421  HGB 13.4  --  11.9*  HCT 42.8  --  37.2*  PLT 183  --  175  LABPROT 25.3*  --  25.6*  INR 2.4*  --  2.4*  CREATININE 0.79  --  0.85  TROPONINIHS 28* 59*  --     Estimated Creatinine Clearance: 74.7 mL/min (by C-G formula based on SCr of 0.85 mg/dL).   Medical History: Past Medical History:  Diagnosis Date  . A-fib ([place])   . Bowel trouble 1999  . Cardiac disease 1987  . 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 Heart attack ([place]) 1987  . History of angina 1965  . History of cardioversion   . Hyperlipidemia   . Hypertension 2007    Medications:  Scheduled:  . amLODipine  10 mg Oral Daily  . atorvastatin  80 mg Oral Daily  . carvedilol  3.125 mg Oral BID WC  . ezetimibe  10 mg Oral Daily  . furosemide  40 mg Intravenous Q12H  . lisinopril  40 mg Oral Daily  . loratadine  10 mg Oral Daily  . pregabalin  150 mg Oral Daily  . sodium chloride flush  3 mL Intravenous Q12H  . Warfarin - Pharmacist Dosing Inpatient   Does not apply q1600    Assessment: Patient admitted x SOB w/ h/o CHF, permanent afib s/p ablation on chronic anticoagulation w/ warfarin PTA, w/ SOB s/t CHF exacerbation as evidenced by CXR showing pulmonary edema and cardiomegaly, BNP of 232, w/ elevated BP of [id number] - [id number] systolic. EKG showing RBBB and LPFB, current INR is 2.4 which is therapeutic.   Home Regimen: Warfarin 6mg :  Mon, Wed, Thurs, Sat, Sun                             Warfarin 7mg : [facility] Ship,  Fri  DATE  INR DOSE 5/7 (PM) 2.4 - 5/8  - 6 mg 5/9                   2.4       6 mg   Goal of Therapy:  INR 2-3 Monitor platelets by anticoagulation protocol: Yes   Plan:  INR therapeutic. Will give patient a dose of warfarin 6 mg po x 1 at 1600 (home dose).  Will monitor daily CBC's and adjust warfarin dose per daily INRs  Oswald Hillock, PharmD, BCPS Clinical Pharmacist 04/27/2020 8:24 AM

## 2020-04-27 NOTE — Progress Notes (Signed)
MD and cardiology notified of 6 bts of VT. Acknowledged. Pt was changed to coreg and cardiology hoping this helps. I will continue to assess.

## 2020-04-28 LAB — BASIC METABOLIC PANEL
Anion gap: 9 (ref 5–15)
BUN: 29 mg/dL — ABNORMAL HIGH (ref 8–23)
CO2: 29 mmol/L (ref 22–32)
Calcium: 9.2 mg/dL (ref 8.9–10.3)
Chloride: 103 mmol/L (ref 98–111)
Creatinine, Ser: 0.88 mg/dL (ref 0.61–1.24)
GFR calc Af Amer: >60 mL/min (ref >60)
GFR calc non Af Amer: >60 mL/min (ref >60)
Glucose, Bld: 122 mg/dL — ABNORMAL HIGH (ref 70–99)
Potassium: 4 mmol/L (ref 3.5–5.1)
Sodium: 141 mmol/L (ref 135–145)

## 2020-04-28 LAB — CBC
HCT: 40 % (ref 39.0–52.0)
Hemoglobin: 13.1 g/dL (ref 13.0–17.0)
MCH: 29.8 pg (ref 26.0–34.0)
MCHC: 32.8 g/dL (ref 30.0–36.0)
MCV: 90.9 fL (ref 80.0–100.0)
Platelets: 185 10*3/uL (ref 150–400)
RBC: 4.4 MIL/uL (ref 4.22–5.81)
RDW: 15.7 % — ABNORMAL HIGH (ref 11.5–15.5)
WBC: 8.3 10*3/uL (ref 4.0–10.5)
nRBC: 0 % (ref 0.0–0.2)

## 2020-04-28 LAB — PROTIME-INR
INR: 2.7 — ABNORMAL HIGH (ref 0.8–1.2)
Prothrombin Time: 27.6 seconds — ABNORMAL HIGH (ref 11.4–15.2)

## 2020-04-28 MED ORDER — WARFARIN SODIUM 6 MG PO TABS
6.0000 mg | ORAL_TABLET | Freq: Once | ORAL | Status: DC
  Filled 2020-04-28: qty 1

## 2020-04-28 NOTE — Evaluation (Signed)
Physical Therapy Evaluation Patient Details Name: John [name] MRN: [id number] DOB: October 24, 1941 Today's Date: 04/28/2020   History of Present Illness  Pt is an 79 y.o. caucasian male with a known history of atrial ablation on chronic anticoagulation with Coumadin, hypertension, dyslipidemia and coronary artery disease, who presented to the emergency room with the onset of worsening dyspnea with associated dry cough and occasional wheezing with minimal lower extremity edema.  MD assessment includes: Acute exacerbation of CHF, HTN, and A-fib.    Clinical Impression  Pt pleasant and motivated to participate during the session.  Pt found on 2LO2/min with SpO2 93% and HR in the 60s. Pt ambulated with increasing distances on 2LO2/min with SpO2 remaining in the mid to upper 90s and HR WNL.  Per MD request pt ambulated on room air with SpO2 remaining in the low to mid 90s during amb but gradually fell to 88-89% once back in sitting, MD notified and pt returned to 2LO2/min.  Pt will benefit from HHPT services upon discharge to safely address deficits listed in patient problem list for decreased caregiver assistance and eventual return to PLOF.      Follow Up Recommendations Home health PT    Equipment Recommendations  None recommended by PT    Recommendations for Other Services       Precautions / Restrictions Precautions Precautions: Fall Restrictions Weight Bearing Restrictions: No      Mobility  Bed Mobility Overal bed mobility: Modified Independent             General bed mobility comments: Extra time and effort  Transfers Overall transfer level: Modified independent Equipment used: None             General transfer comment: Good eccentric and concentric control and stability  Ambulation/Gait Ambulation/Gait assistance: Supervision Gait Distance (Feet): 125 Feet x 1, 60 Feet x 2, 30 Feet x 1 Assistive device: None Gait Pattern/deviations: Step-through  pattern;Decreased step length - right;Decreased step length - left Gait velocity: decreased   General Gait Details: Slow cadence with short B step length but steady without LOB  Stairs            Wheelchair Mobility    Modified Rankin (Stroke Patients Only)       Balance Overall balance assessment: No apparent balance deficits (not formally assessed)                                           Pertinent Vitals/Pain Pain Assessment: No/denies pain    Home Living Family/patient expects to be discharged to:: Private residence Living Arrangements: Spouse/significant other Available Help at Discharge: Family;Available 24 hours/day Type of Home: House Home Access: Stairs to enter Entrance Stairs-Rails: Right Entrance Stairs-Number of Steps: 3 Home Layout: One level Home Equipment: Grab bars - tub/shower;Walker - 2 wheels;Cane - single point      Prior Function Level of Independence: Independent         Comments: Ind amb community distances without an AD, no fall history, Ind with ADLs, active with outdoor yardwork     Hand Dominance   Dominant Hand: Right    Extremity/Trunk Assessment   Upper Extremity Assessment Upper Extremity Assessment: Overall WFL for tasks assessed    Lower Extremity Assessment Lower Extremity Assessment: Generalized weakness       Communication   Communication: No difficulties  Cognition Arousal/Alertness: Awake/alert Behavior During Therapy:  WFL for tasks assessed/performed Overall Cognitive Status: Within Functional Limits for tasks assessed                                        General Comments      Exercises Total Joint Exercises Ankle Circles/Pumps: AROM;Strengthening;Both;15 reps Quad Sets: Strengthening;Both;10 reps Gluteal Sets: Strengthening;Both;10 reps Long Arc Quad: AROM;Strengthening;Both;15 reps Knee Flexion: AROM;Strengthening;Both;15 reps Marching in Standing:  Strengthening;Both;10 reps;Seated Other Exercises Other Exercises: HEP education for BLE APs, QS, GS, and LAQ x 10 every 1-2 hours daily   Assessment/Plan    PT Assessment Patient needs continued PT services  PT Problem List Decreased strength;Decreased activity tolerance       PT Treatment Interventions Gait training;DME instruction;Stair training;Therapeutic activities;Therapeutic exercise;Patient/family education;Balance training    PT Goals (Current goals can be found in the Care Plan section)  Acute Rehab PT Goals Patient Stated Goal: To return home PT Goal Formulation: With patient Time For Goal Achievement: 05/11/20 Potential to Achieve Goals: Good    Frequency Min 2X/week   Barriers to discharge        Co-evaluation               AM-PAC PT "6 Clicks" Mobility  Outcome Measure Help needed turning from your back to your side while in a flat bed without using bedrails?: None Help needed moving from lying on your back to sitting on the side of a flat bed without using bedrails?: None Help needed moving to and from a bed to a chair (including a wheelchair)?: None Help needed standing up from a chair using your arms (e.g., wheelchair or bedside chair)?: None Help needed to walk in hospital room?: A Little Help needed climbing 3-5 steps with a railing? : A Little 6 Click Score: 22    End of Session Equipment Utilized During Treatment: Gait belt;Oxygen Activity Tolerance: Patient tolerated treatment well Patient left: in chair;with call bell/phone within reach;with chair alarm set;with family/visitor present Nurse Communication: Mobility status;Other (comment)(MD notified of pt's response to activity on room air) PT Visit Diagnosis: Muscle weakness (generalized) (M62.81)    Time: [id number] PT Time Calculation (min) (ACUTE ONLY): 44 min   Charges:   PT Evaluation $PT Eval Moderate Complexity: 1 Mod PT Treatments $Gait Training: 8-22 mins $Therapeutic  Exercise: 8-22 mins        D. Scott Stark Aguinaga PT, DPT 04/28/20, 10:52 AM

## 2020-04-28 NOTE — Plan of Care (Signed)
Nutrition Education Note  RD consulted for nutrition education regarding CHF.  79 y.o. caucasian male with a known history of atrial ablation on chronic anticoagulation with Coumadin, hypertension, dyslipidemia and coronary artery disease, who presented to the emergency room with the onset of worsening dyspnea with associated dry cough and occasional wheezing with minimal lower extremity edema.  MD assessment includes: Acute exacerbation of CHF, HTN, and A-fib.   RD provided "Low Sodium Nutrition Therapy" handout from the [employer]. Reviewed patient's dietary recall. Provided examples on ways to decrease sodium intake in diet. Discouraged intake of processed foods and use of salt shaker. Encouraged fresh fruits and vegetables as well as whole grain sources of carbohydrates to maximize fiber intake.   RD discussed why it is important for patient to adhere to diet recommendations, and emphasized the role of fluids, foods to avoid, and importance of weighing self daily. Teach back method used.  Expect good compliance.  Body mass index is 30.29 kg/m. Pt meets criteria for obesity based on current BMI.  Current diet order is HH, patient is consuming approximately 100% of meals at this time. Labs and medications reviewed. No further nutrition interventions warranted at this time. RD contact information provided. If additional nutrition issues arise, please re-consult RD.   Koleen Distance MS, RD, LDN Please refer to [facility] for RD and/or RD on-call/weekend/after hours pager

## 2020-04-28 NOTE — Progress Notes (Signed)
[facility] Encounter Note  Patient: LANCER PERCLE / Admit Date: 04/25/2020 / Date of Encounter: 04/28/2020, 1:11 PM   Subjective: Patient has had some overall improvements of congestive heart failure and shortness of breath.  Patient has still mild pulmonary edema but no apparent significant hypoxia with ambulation.  Troponin level has been elevated most consistent with demand ischemia rather than acute coronary syndrome.  Telemetry shows atrial fibrillation with more controlled heart rate Echocardiogram showing global LV systolic dysfunction with ejection fraction of 35% with moderate dilation moderate to severe mitral regurgitation and mild aortic stenosis contributing to above  Review of Systems: Positive for: Shortness of breath Negative for: Vision change, hearing change, syncope, dizziness, nausea, vomiting,diarrhea, bloody stool, stomach pain, cough, congestion, diaphoresis, urinary frequency, urinary pain,skin lesions, skin rashes Others previously listed  Objective: Telemetry: Atrial fibrillation with controlled ventricular rate Physical Exam: Blood pressure 129/68, pulse 66, temperature (!) 97.4 F (36.3 C), temperature source Oral, resp. rate 16, height 5\' 8"  (1.727 m), weight 90.4 kg, SpO2 96 %. Body mass index is 30.29 kg/m. General: Well developed, well nourished, in no acute distress. Head: Normocephalic, atraumatic, sclera non-icteric, no xanthomas, nares are without discharge. Neck: No apparent masses Lungs: Normal respirations with no wheezes, no rhonchi, no rales , no crackles   Heart: Irregular rate and rhythm, normal S1 S2, apical and right upper sternal border murmur, no rub, no gallop, PMI is normal size and placement, carotid upstroke normal without bruit, jugular venous pressure normal Abdomen: Soft, non-tender, non-distended with normoactive bowel sounds. No hepatosplenomegaly. Abdominal aorta is normal size without bruit Extremities: Trace  to 1+ edema, no clubbing, no cyanosis, no ulcers,  Peripheral: 2+ radial, 2+ femoral, 2+ dorsal pedal pulses Neuro: Alert and oriented. Moves all extremities spontaneously. Psych:  Responds to questions appropriately with a normal affect.   Intake/Output Summary (Last 24 hours) at 04/28/2020 1311 Last data filed at 04/28/2020 0930 Gross per 24 hour  Intake 1680 ml  Output 1950 ml  Net -270 ml    Inpatient Medications:  . amLODipine  10 mg Oral Daily  . atorvastatin  80 mg Oral Daily  . carvedilol  3.125 mg Oral BID WC  . ezetimibe  10 mg Oral Daily  . furosemide  40 mg Intravenous Q12H  . lisinopril  40 mg Oral Daily  . loratadine  10 mg Oral Daily  . pregabalin  150 mg Oral Daily  . sodium chloride flush  3 mL Intravenous Q12H  . warfarin  6 mg Oral ONCE-1600  . Warfarin - Pharmacist Dosing Inpatient   Does not apply q1600   Infusions:  . sodium chloride      Labs: Recent Labs    04/25/20 2325 04/27/20 0421 04/27/20 1347 04/28/20 0623  NA 141   < > 142 141  K 3.9   < > 3.8 4.0  CL 108   < > 104 103  CO2 22   < > 29 29  GLUCOSE 160*   < > 132* 122*  BUN 24*   < > 25* 29*  CREATININE 0.79   < > 0.95 0.88  CALCIUM 9.3   < > 9.0 9.2  MG 2.1  --  1.9  --    < > = values in this interval not displayed.   No results for input(s): AST, ALT, ALKPHOS, BILITOT, PROT, ALBUMIN in the last 72 hours. Recent Labs    04/27/20 0421 04/28/20 0623  WBC 8.1 8.3  HGB  11.9* 13.1  HCT 37.2* 40.0  MCV 93.2 90.9  PLT 175 185   No results for input(s): CKTOTAL, CKMB, TROPONINI in the last 72 hours. Invalid input(s): POCBNP No results for input(s): HGBA1C in the last 72 hours.   Weights: Filed Weights   04/25/20 2313 04/27/20 0449 04/28/20 0355  Weight: 93.9 kg 91.1 kg 90.4 kg     Radiology/Studies:  DG Chest 1 View  Result Date: 04/25/2020 CLINICAL DATA:  Shortness of breath for 3 days, fatigue EXAM: CHEST  1 VIEW COMPARISON:  04/23/2020 FINDINGS: Single frontal view of  the chest demonstrates an enlarged cardiac silhouette. There is increased vascular congestion with diffuse interstitial prominence. Trace bilateral effusions. No pneumothorax. IMPRESSION: 1. Findings consistent with developing pulmonary edema. Electronically Signed   By: Randa Ngo M.D.   On: 04/25/2020 23:51   DG Chest 2 View  Result Date: 04/23/2020 CLINICAL DATA:  Dyspnea and short of breath EXAM: CHEST - 2 VIEW COMPARISON:  None. FINDINGS: Small right-sided pleural effusion. Cardiomegaly with aortic atherosclerosis. No pneumothorax. IMPRESSION: 1. Mild cardiomegaly with small right-sided pleural effusion. Electronically Signed   By: Donavan Foil M.D.   On: 04/23/2020 23:14   ECHOCARDIOGRAM COMPLETE  Result Date: 04/27/2020    ECHOCARDIOGRAM REPORT   Patient Name:   MATTHEO FREEHILL Date of Exam: 04/26/2020 Medical Rec #:  [id number]       Height:       68.0 in Accession #:    [id number]      Weight:       207.0 lb Date of Birth:  [birth year]        BSA:          2.074 m Patient Age:    52 years        BP:           Not listed in chart/Not listed in                                               chart mmHg Patient Gender: M               HR:           93 bpm. Exam Location:  [facility] Procedure: 2D Echo, Cardiac Doppler and Color Doppler Indications:     CHF- acute diastolic [id number]  History:         Patient has no prior history of Echocardiogram examinations.                  Arrythmias:Atrial Fibrillation; Risk Factors:Hypertension.                  Hearst attack.  Sonographer:     Sherrie Sport RDCS (AE) Referring Phys:  [id number] [place] Diagnosing Phys: Serafina Royals MD IMPRESSIONS  1. Left ventricular ejection fraction, by estimation, is 35 to 40%. The left ventricle has moderately decreased function. The left ventricle demonstrates global hypokinesis. The left ventricular internal cavity size was moderately dilated. Left ventricular diastolic function could not be evaluated.  2. Right ventricular systolic  function is normal. The right ventricular size is normal. There is moderately elevated pulmonary artery systolic pressure.  3. Left atrial size was moderately dilated.  4. The mitral valve is grossly normal. Moderate to severe mitral valve regurgitation.  5. The aortic valve is normal in structure.  Aortic valve regurgitation is trivial. Mild aortic valve stenosis. FINDINGS  Left Ventricle: Left ventricular ejection fraction, by estimation, is 35 to 40%. The left ventricle has moderately decreased function. The left ventricle demonstrates global hypokinesis. The left ventricular internal cavity size was moderately dilated. There is borderline left ventricular hypertrophy. Left ventricular diastolic function could not be evaluated. Right Ventricle: The right ventricular size is normal. No increase in right ventricular wall thickness. Right ventricular systolic function is normal. There is moderately elevated pulmonary artery systolic pressure. The tricuspid regurgitant velocity is 3.09 m/s, and with an assumed right atrial pressure of 10 mmHg, the estimated right ventricular systolic pressure is [id number] mmHg. Left Atrium: Left atrial size was moderately dilated. Right Atrium: Right atrial size was normal in size. Pericardium: There is no evidence of pericardial effusion. Mitral Valve: The mitral valve is grossly normal. Moderate to severe mitral valve regurgitation. Tricuspid Valve: The tricuspid valve is normal in structure. Tricuspid valve regurgitation is mild. Aortic Valve: The aortic valve is normal in structure. Aortic valve regurgitation is trivial. Aortic regurgitation PHT measures 420 msec. Mild aortic stenosis is present. Aortic valve mean gradient measures 11.0 mmHg. Aortic valve peak gradient measures 19.7 mmHg. Aortic valve area, by VTI measures 0.95 cm. Pulmonic Valve: The pulmonic valve was normal in structure. Pulmonic valve regurgitation is trivial. Aorta: The aortic root and ascending aorta are  structurally normal, with no evidence of dilitation. IAS/Shunts: No atrial level shunt detected by color flow Doppler.  LEFT VENTRICLE PLAX 2D LVIDd:         6.29 cm LVIDs:         5.27 cm LV PW:         1.26 cm LV IVS:        1.01 cm LVOT diam:     2.00 cm LV SV:         40 LV SV Index:   19 LVOT Area:     3.14 cm  LV Volumes (MOD) LV vol d, MOD A2C: 185.0 ml LV vol d, MOD A4C: 151.0 ml LV vol s, MOD A2C: 115.0 ml LV vol s, MOD A4C: 70.3 ml LV SV MOD A2C:     70.0 ml LV SV MOD A4C:     151.0 ml LV SV MOD BP:      87.9 ml RIGHT VENTRICLE RV Basal diam:  4.44 cm RV S prime:     12.90 cm/s TAPSE (M-mode): 4.4 cm LEFT ATRIUM              Index       RIGHT ATRIUM           Index LA diam:        5.50 cm  2.65 cm/m  RA Area:     38.10 cm LA Vol (A2C):   134.0 ml 64.61 ml/m RA Volume:   163.00 ml 78.60 ml/m LA Vol (A4C):   88.2 ml  42.53 ml/m LA Biplane Vol: 108.0 ml 52.08 ml/m  AORTIC VALVE                    PULMONIC VALVE AV Area (Vmax):    0.94 cm     PV Vmax:        0.98 m/s AV Area (Vmean):   0.89 cm     PV Peak grad:   3.9 mmHg AV Area (VTI):     0.95 cm     RVOT Peak grad: 3 mmHg AV Vmax:  222.00 cm/s AV Vmean:          154.667 cm/s AV VTI:            0.418 m AV Peak Grad:      19.7 mmHg AV Mean Grad:      11.0 mmHg LVOT Vmax:         66.60 cm/s LVOT Vmean:        44.000 cm/s LVOT VTI:          0.126 m LVOT/AV VTI ratio: 0.30 AI PHT:            420 msec  AORTA Ao Root diam: 3.30 cm MITRAL VALVE                TRICUSPID VALVE MV Area (PHT): 6.17 cm     TR Peak grad:   38.2 mmHg MV Decel Time: 123 msec     TR Vmax:        309.00 cm/s MV E velocity: 129.00 cm/s                             SHUNTS                             Systemic VTI:  0.13 m                             Systemic Diam: 2.00 cm Serafina Royals MD Electronically signed by Serafina Royals MD Signature Date/Time: 04/27/2020/6:04:31 AM    Final      Assessment and Recommendation  79 y.o. male with acute systolic dysfunction congestive  heart failure multifactorial in nature including LV systolic dysfunction moderate to severe mitral regurgitation and atrial fibrillation slightly improved at this time after intravenous Lasix without evidence of myocardial infarction but elevated troponin consistent with demand ischemia 1.  Continue intravenous furosemide for continued reduction of pulmonary edema hypoxia and acute systolic dysfunction heart failure this am and ok for change to oral 40mg  for discharge 2.  Continue medication management for heart rate control including b-blcoker 3.  Continue ACE inhibitor for cardiomyopathy and congestive heart failure 4.  High intensity cholesterol therapy for history of cardiovascular disease 5.  Begin ambulation and follow for improvements of symptoms and further adjustments and ok for dc to home from cardiac standpoint when oxygenation normal   Signed, Serafina Royals M.D. FACC

## 2020-04-28 NOTE — Progress Notes (Signed)
PROGRESS NOTE  John [name] [id number] DOB: [birth year] DOA: 04/25/2020 PCP: Gayland Curry, MD  Brief History   John [name] is a 79 y.o. caucasian male with a known history of atrial ablation on chronic anticoagulation with Coumadin, hypertension, dyslipidemia and coronary artery disease, who presented to the emergency room with the onset of worsening dyspnea with associated dry cough and occasional wheezing with minimal lower extremity edema.  He denied any fever or chills.  No chest pain or palpitations.  No nausea or vomiting or abdominal pain.  No dysuria, oliguria or hematuria or flank pain.  Upon presentation in the emergency room, blood pressure was 164/74 with a pulse of 122 and respiratory 28 with pulse ox of 83% on room air that was up to 96-99% on 6 L of O2 by nasal cannula.  Labs revealed a BUN of 24 and BNP was 232 with high-sensitivity troponin I of 28 and later 59.  CBC is unremarkable.  INR was 2.4 with PTT 25.3.  Influenza antigens and COVID-19 PCR were negative.  Chest x-ray showed pulmonary edema. 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  The patient was given 40 mg of IV Lasix.  He will be admitted to a progressive cardiac unit bed for further evaluation and management. Cardiology has been consulted. Recommendation is to continue IV diuresis. He feels that there is no concern for actual ACS, but instead due to simple demand ischemia. He recommends change to beta blocker from digoxin for addition of risk reduction to rate control. ACE Inhibitor was continued as was high intensity cholesterol therapy.   Consultants  . Cardiology  Procedures  . None  Antibiotics   Anti-infectives (From admission, onward)   None     Subjective  The patient is resting comfortably. No new complaints.  Objective   Vitals:  Vitals:   04/28/20 1606 04/28/20 1733  BP: (!) 126/58     Pulse: (!) 53 64  Resp: 16   Temp: 98.2 F (36.8 C)   SpO2: 92%     Exam:  Constitutional:  . The patient is awake, alert, and oriented x 3. No acute distress. Respiratory:  . No increased work of breathing. . No wheezes or rhonchi . Diminishing rales at bases bilaterally . No tactile fremitus . Diminished breath sounds bilaterally Cardiovascular:  . Regular rate and rhythm . No murmurs, ectopy, or gallups. . No lateral PMI. No thrills. Abdomen:  . Abdomen is soft, non-tender, non-distended . No hernias, masses, or organomegaly . Normoactive bowel sounds.  Musculoskeletal:  . No cyanosis or clubbing . +1 pitting edema of lower extremities bilaterally Skin:  . No rashes, lesions, ulcers . palpation of skin: no induration or nodules Neurologic:  . CN 2-12 intact . Sensation all 4 extremities intact Psychiatric:  . Mental status o Mood, affect appropriate o Orientation to person, place, time  . judgment and insight appear intact  I have personally reviewed the following:   Today's Data  . Vitals, BMP, CBC, INR.  Imaging  . CXR: pulmonary edema  Cardiology Data  . EKG: Atrial fibrillation, rate of 90.  Scheduled Meds: . amLODipine  10 mg Oral Daily  . atorvastatin  80 mg Oral Daily  . carvedilol  3.125 mg Oral BID WC  . ezetimibe  10 mg Oral Daily  . furosemide  40 mg Intravenous Q12H  . lisinopril  40 mg Oral Daily  . loratadine  10  mg Oral Daily  . pregabalin  150 mg Oral Daily  . sodium chloride flush  3 mL Intravenous Q12H  . warfarin  6 mg Oral ONCE-1600  . Warfarin - Pharmacist Dosing Inpatient   Does not apply q1600   Continuous Infusions: . sodium chloride      Principal Problem:   Acute exacerbation of CHF (congestive heart failure) (HCC) Active Problems:   Acute CHF (congestive heart failure) (HCC)   Uncontrolled hypertension   Dyslipidemia   AF (paroxysmal atrial fibrillation) (HCC)   Gout   LOS: 2 days   A & P   No problems  updated. Acute exacerbation of CHF: The patient has been admitted to a progressive care unit. @D  echocardiogram has been performed. Report is pending. Cariology has been consulted. Recommendation is to continue IV diuresis and change to oral medication in the morining. He feels that there is no concern for actual ACS, but instead due to simple demand ischemia. He recommends change to beta blocker from digoxin for addition of risk reduction to rate control. ACE Inhibitor was continued as was high intensity cholesterol therapy.   Elevated troponins: These are being followed. They are rising. I appreciate Dr. Alveria Apley assistance.   Uncontrolled hypertension: Blood pressures are well controlled on Norvasc, lisinopril, and  The patient has been continued on norvasc, coreg, and lisinopril. He also has been placed on IV labetalol. Improved BP.  Dyslipidemia: Statin therapy will be resumed.  Atrial fibrillation: We will continue digoxin and Coumadin. Monitor on telemetry.  Gout: Monitor for flare  DVT Prophylaxis: Continue coumadin CODE STATUS: Full code Family Communication: None available Disposition: The patient is from home. It is anticipated that he will be discharged to home with home health PT/OT. Barriers to discharge include acute exacerbation of CHF and elevated troponins. Will transition to oral diuretics in the mornings.  Samone Guhl, DO [facility] Direct contact: see [email or web address]  7PM-7AM contact night coverage as above 04/28/2020, 5:44 PM  LOS: 0 days

## 2020-04-28 NOTE — TOC Initial Note (Signed)
Transition of Care [facility]) - Initial/Assessment Note    Patient Details  Name: John [name] MRN: [id number] Date of Birth: 11-Mar-1941  [facility] CM/SW Contact:    Eileen Stanford, LCSW Phone Number: 04/28/2020, 1:51 PM  Clinical Narrative:     Pt lives at home with spouse. Pt is refusing HH stating he doesn't need it. Pt states he has a cane and walker at home. NO additional needs at this time.   1.  Do you have a scale? Yes 2.  Do you weigh yourself daily? Yes 3.  Two pounds over night or 5 in one week call your doctor because it could be fluid overload?Aware  4.  Do you go to the Heart Failure Clinic? No pt goes to his cardiologist 5. An appointment with heart failure clinic will be made before discharge if. 6. If patient is home bound, home health can be arranged with an agency that provides a heart failure protocol.               Expected Discharge Plan: Home/Self Care Barriers to Discharge: Continued Medical Work up   Patient Goals and CMS Choice Patient states their goals for this hospitalization and ongoing recovery are:: to go home      Expected Discharge Plan and Services Expected Discharge Plan: Home/Self Care In-house Referral: Clinical Social Work   Post Acute Care Choice: NA Living arrangements for the past 2 months: Single Family Home                                      Prior Living Arrangements/Services Living arrangements for the past 2 months: Single Family Home Lives with:: Spouse Patient language and need for interpreter reviewed:: Yes        Need for Family Participation in Patient Care: Yes (Comment) Care giver support system in place?: Yes (comment)   Criminal Activity/Legal Involvement Pertinent to Current Situation/Hospitalization: No - Comment as needed  Activities of Daily Living Home Assistive Devices/Equipment: None ADL Screening (condition at time of admission) Patient's cognitive ability adequate to safely  complete daily activities?: Yes Is the patient deaf or have difficulty hearing?: No Does the patient have difficulty seeing, even when wearing glasses/contacts?: No Does the patient have difficulty concentrating, remembering, or making decisions?: No Patient able to express need for assistance with ADLs?: Yes Does the patient have difficulty dressing or bathing?: No Independently performs ADLs?: Yes (appropriate for developmental age) Does the patient have difficulty walking or climbing stairs?: No Weakness of Legs: None Weakness of Arms/Hands: None  Permission Sought/Granted Permission sought to share information with : Family Supports Permission granted to share information with : Yes, Verbal Permission Granted  Share Information with NAME: Herbert Pun     Permission granted to share info w Relationship: spouse     Emotional Assessment Appearance:: Appears stated age Attitude/Demeanor/Rapport: Engaged Affect (typically observed): Accepting, Appropriate Orientation: : Oriented to Place, Oriented to Self, Oriented to  Time, Oriented to Situation Alcohol / Substance Use: Not Applicable Psych Involvement: No (comment)  Admission diagnosis:  SOB (shortness of breath) [R06.02] Hypoxia [R09.02] Acute CHF (congestive heart failure) (HCC) [I50.9] Patient Active Problem List   Diagnosis Date Noted  . Acute CHF (congestive heart failure) ([place]) 04/26/2020  . Acute exacerbation of CHF (congestive heart failure) ([place]) 04/26/2020  . Uncontrolled hypertension 04/26/2020  . Dyslipidemia 04/26/2020  . AF (paroxysmal atrial fibrillation) ([place])  04/26/2020  . Gout 04/26/2020  . Personal history of colonic polyps 04/04/2013  . Bowel trouble   . Cardiac disease    PCP:  Gayland Curry, MD Pharmacy:   [id number] [place], [place] [address] [address] Phone: [phone] Fax: [phone]  [facility] [place], [place] [address] [place] [address] Phone: [phone] Fax: [phone]     Social Determinants of Health (SDOH) Interventions    Readmission Risk Interventions No flowsheet data found.

## 2020-04-28 NOTE — Progress Notes (Signed)
ANTICOAGULATION CONSULT NOTE - Initial Consult  Pharmacy Consult for warfarin Indication: atrial fibrillation  Allergies  Allergen Reactions  . Procan Sr [Procainamide] Rash    Patient Measurements: Height: 5\' 8"  (172.7 cm) Weight: 90.4 kg (199 lb 3.2 oz) IBW/kg (Calculated) : 68.4  Vital Signs: Temp: 97.7 F (36.5 C) (05/10 0749) Temp Source: Oral (05/10 0749) BP: 143/58 (05/10 0749) Pulse Rate: 42 (05/10 0749)  Labs: Recent Labs    04/25/20 2325 04/25/20 2325 04/26/20 0149 04/27/20 0421 04/27/20 1347 04/28/20 0623  HGB 13.4   < >  --  11.9*  --  13.1  HCT 42.8  --   --  37.2*  --  40.0  PLT 183  --   --  175  --  185  LABPROT 25.3*  --   --  25.6*  --  27.6*  INR 2.4*  --   --  2.4*  --  2.7*  CREATININE 0.79  --   --  0.85 0.95  --   TROPONINIHS 28*  --  59*  --   --   --    < > = values in this interval not displayed.    Estimated Creatinine Clearance: 66.6 mL/min (by C-G formula based on SCr of 0.95 mg/dL).   Medical History: Past Medical History:  Diagnosis Date  . A-fib ([place])   . Bowel trouble 1999  . Cardiac disease 1987  . 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 Heart attack ([place]) 1987  . History of angina 1965  . History of cardioversion   . Hyperlipidemia   . Hypertension 2007    Medications:  Scheduled:  . amLODipine  10 mg Oral Daily  . atorvastatin  80 mg Oral Daily  . carvedilol  3.125 mg Oral BID WC  . ezetimibe  10 mg Oral Daily  . furosemide  40 mg Intravenous Q12H  . lisinopril  40 mg Oral Daily  . loratadine  10 mg Oral Daily  . pregabalin  150 mg Oral Daily  . sodium chloride flush  3 mL Intravenous Q12H  . Warfarin - Pharmacist Dosing Inpatient   Does not apply q1600    Assessment: Patient admitted x SOB w/ h/o CHF, permanent afib s/p ablation on chronic anticoagulation w/ warfarin PTA, w/ SOB s/t CHF exacerbation as evidenced by CXR showing pulmonary edema and cardiomegaly, BNP of 232, w/ elevated BP of [id number] - [id number]  systolic. EKG showing RBBB and LPFB, current INR is 2.4 which is therapeutic.   Home Regimen: Warfarin 6mg :  Mon, Wed, Thurs, Sat, Sun                             Warfarin 7mg : Tues, Fri  DATE  INR DOSE 5/7 (PM) 2.4 - 5/8  - 6 mg 5/9                   2.4       6 mg  5/10                 2.7       6 mg  Goal of Therapy:  INR 2-3 Monitor platelets by anticoagulation protocol: Yes   Plan:  INR therapeutic. Will give patient a dose of warfarin 6 mg po x 1 at 1600 (home dose).  Will monitor daily CBC's and adjust warfarin dose per daily INRs  Oswald Hillock, PharmD, BCPS Clinical Pharmacist  04/28/2020 8:05 AM

## 2020-04-29 DIAGNOSIS — I5021 Acute systolic (congestive) heart failure: Secondary | ICD-10-CM

## 2020-04-29 DIAGNOSIS — M1000 Idiopathic gout, unspecified site: Secondary | ICD-10-CM

## 2020-04-29 LAB — CBC
HCT: 38.8 % — ABNORMAL LOW (ref 39.0–52.0)
Hemoglobin: 13 g/dL (ref 13.0–17.0)
MCH: 30.1 pg (ref 26.0–34.0)
MCHC: 33.5 g/dL (ref 30.0–36.0)
MCV: 89.8 fL (ref 80.0–100.0)
Platelets: 192 10*3/uL (ref 150–400)
RBC: 4.32 MIL/uL (ref 4.22–5.81)
RDW: 15.5 % (ref 11.5–15.5)
WBC: 8.1 10*3/uL (ref 4.0–10.5)
nRBC: 0 % (ref 0.0–0.2)

## 2020-04-29 LAB — BASIC METABOLIC PANEL
Anion gap: 10 (ref 5–15)
BUN: 37 mg/dL — ABNORMAL HIGH (ref 8–23)
CO2: 29 mmol/L (ref 22–32)
Calcium: 9.4 mg/dL (ref 8.9–10.3)
Chloride: 102 mmol/L (ref 98–111)
Creatinine, Ser: 0.94 mg/dL (ref 0.61–1.24)
GFR calc Af Amer: >60 mL/min (ref >60)
GFR calc non Af Amer: >60 mL/min (ref >60)
Glucose, Bld: 106 mg/dL — ABNORMAL HIGH (ref 70–99)
Potassium: 3.6 mmol/L (ref 3.5–5.1)
Sodium: 141 mmol/L (ref 135–145)

## 2020-04-29 LAB — PROTIME-INR
INR: 2.4 — ABNORMAL HIGH (ref 0.8–1.2)
Prothrombin Time: 25.3 seconds — ABNORMAL HIGH (ref 11.4–15.2)

## 2020-04-29 LAB — MAGNESIUM: Magnesium: 2.1 mg/dL (ref 1.7–2.4)

## 2020-04-29 MED ORDER — LISINOPRIL 40 MG PO TABS: 40.0000 mg | ORAL_TABLET | Freq: Every day | ORAL | Status: AC

## 2020-04-29 MED ORDER — CARVEDILOL 3.125 MG PO TABS: 3.1250 mg | ORAL_TABLET | Freq: Two times a day (BID) | ORAL | 0 refills | Status: AC

## 2020-04-29 MED ORDER — WARFARIN SODIUM 6 MG PO TABS
7.0000 mg | ORAL_TABLET | Freq: Once | ORAL | Status: DC
  Filled 2020-04-29: qty 1

## 2020-04-29 MED ORDER — FUROSEMIDE 40 MG PO TABS
40.0000 mg | ORAL_TABLET | Freq: Two times a day (BID) | ORAL | 11 refills | Status: AC
Start: 2020-04-29 — End: 2021-04-29

## 2020-04-29 NOTE — Plan of Care (Signed)
°  Problem: Education: °Goal: Ability to demonstrate management of disease process will improve °Outcome: Progressing °Goal: Ability to verbalize understanding of medication therapies will improve °Outcome: Progressing °Goal: Individualized Educational Video(s) °Outcome: Progressing °  °

## 2020-04-29 NOTE — Care Management Important Message (Signed)
Important Message  Patient Details  Name: John [name] MRN: [id number] Date of Birth: November 06, 1941   Medicare Important Message Given:  Yes     Dannette Barbara 04/29/2020, 11:09 AM

## 2020-04-29 NOTE — Discharge Summary (Signed)
Physician Discharge Summary  John [name] [id number] DOB: 1941/12/03 DOA: 04/25/2020  PCP: Gayland Curry, MD  Admit date: 04/25/2020 Discharge date: 04/29/2020  Recommendations for Outpatient Follow-up:  1. Follow up with PCP in 7-10 days.  2. Have chemistry checked at that visit. 3. Follow up with heart failure clinic in 2 weeks.  Follow-up Information    [facility] Follow up on 05/06/2020.   Specialty: Cardiology Why: at 12:30pm. Enter through the [facility] entrance Contact information: [place] [phone]         Discharge Diagnoses: Principal diagnosis is #1 1. Acute exacerbation of CHF 2. Elevated troponins. Consistent with demand ischemia 3. Uncontrolled hypertension 4. Dyslipidemia 5. Atrial fibrillation 6. Gout  Discharge Condition: Fair  Disposition: Home  Diet recommendation: Heart healthy  Filed Weights   04/27/20 0449 04/28/20 0355 04/29/20 0509  Weight: 91.1 kg 90.4 kg 88.9 kg   History of present illness:  John [name]  is a 79 y.o. Caucasian male with a known history of atrial ablation on chronic anticoagulation with Coumadin, hypertension, dyslipidemia and coronary artery disease, who presented to the emergency room with the onset of worsening dyspnea with associated dry cough and occasional wheezing with minimal lower extremity edema.  He denied any fever or chills.  No chest pain or palpitations.  No nausea or vomiting or abdominal pain.  No dysuria, oliguria or hematuria or flank pain.  Upon presentation in the emergency room, blood pressure was 164/74 with a pulse of 122 and respiratory 28 with pulse ox of 83% on room air that was up to 96-99% on 6 L of O2 by nasal cannula.  Labs revealed a BUN of 24 and BNP was 232 with high-sensitivity troponin I of 28 and later 59.  CBC is unremarkable.  INR was 2.4 with PTT 25.3.  Influenza antigens and  COVID-19 PCR were negative.  Chest x-ray showed pulmonary edema. 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  The patient was given 40 mg of IV Lasix.  He will be admitted to a progressive cardiac unit bed for further evaluation and management  Hospital Course:  The patient was given 40 mg of IV Lasix. He will be admitted to a progressive cardiac unit bed for further evaluation and management. Cardiology has been consulted. Recommendation is to continue IV diuresis. He feels that there is no concern for actual ACS, but instead due to simple demand ischemia. He recommends change to beta blocker from digoxin for addition of risk reduction to rate control. ACE Inhibitor was continued as was high intensity cholesterol therapy.   The patient was cleared for discharge from cardiology. The patient will be discharged to home in good condition.  Today's assessment: S: The patient was sitting up at bedside. No new complaints. O: Vitals:  Vitals:   04/29/20 1029 04/29/20 1135  BP:  (!) 145/49  Pulse:  (!) 58  Resp:  18  Temp:  98.1 F (36.7 C)  SpO2: 95% 92%   Exam:  Constitutional:  . The patient is awake, alert, and oriented x 3. No acute distress. Respiratory:  . No increased work of breathing. . No wheezes, rales, or rhonchi . No tactile fremitus Cardiovascular:  . Regular rate and rhythm . No murmurs, ectopy, or gallups. . No lateral PMI. No thrills. Abdomen:  . Abdomen is soft, non-tender, non-distended .  No hernias, masses, or organomegaly . Normoactive bowel sounds.  Musculoskeletal:  . No cyanosis, clubbing, or edema Skin:  . No rashes, lesions, ulcers . palpation of skin: no induration or nodules Neurologic:  . CN 2-12 intact . Sensation all 4 extremities intact Psychiatric:  . Mental status o Mood, affect appropriate o Orientation to person, place, time  . judgment and  insight appear intact  Discharge Instructions  Discharge Instructions    (HEART FAILURE PATIENTS) Call MD:  Anytime you have any of the following symptoms: 1) 3 pound weight gain in 24 hours or 5 pounds in 1 week 2) shortness of breath, with or without a dry hacking cough 3) swelling in the hands, feet or stomach 4) if you have to sleep on extra pillows at night in order to breathe.   Complete by: As directed    AMB referral to CHF clinic   Complete by: As directed    Call MD for:  difficulty breathing, headache or visual disturbances   Complete by: As directed    Call MD for:  extreme fatigue   Complete by: As directed    Call MD for:  persistant dizziness or light-headedness   Complete by: As directed    Call MD for:  temperature >100.4   Complete by: As directed    Diet - low sodium heart healthy   Complete by: As directed    Discharge instructions   Complete by: As directed    Follow up with PCP in 7-10 days. Have chemistry checked at that visit. Follow up with heart failure clinic in 2 weeks.   Heart Failure patients record your daily weight using the same scale at the same time of day   Complete by: As directed    Increase activity slowly   Complete by: As directed      Allergies as of 04/29/2020      Reactions   Procan Sr [procainamide] Rash      Medication List    STOP taking these medications   digoxin 0.125 MG tablet Commonly known as: LANOXIN     TAKE these medications   amLODipine 10 MG tablet Commonly known as: NORVASC Take 10 mg by mouth daily.   atorvastatin 80 MG tablet Commonly known as: LIPITOR Take 80 mg by mouth daily.   carvedilol 3.125 MG tablet Commonly known as: COREG Take 1 tablet (3.125 mg total) by mouth 2 (two) times daily with a meal.   ezetimibe 10 MG tablet Commonly known as: ZETIA Take 10 mg by mouth daily.   furosemide 40 MG tablet Commonly known as: Lasix Take 1 tablet (40 mg total) by mouth 2 (two) times daily.     lisinopril 40 MG tablet Commonly known as: ZESTRIL Take 1 tablet (40 mg total) by mouth daily. Start taking on: Apr 30, 2020 What changed: when to take this   Lyrica 150 MG capsule Generic drug: pregabalin Take 150 mg by mouth daily.   montelukast 10 MG tablet Commonly known as: SINGULAIR Take 10 mg by mouth daily.   nitroGLYCERIN 0.4 MG SL tablet Commonly known as: NITROSTAT Place 0.4 mg under the tongue every 5 (five) minutes as needed for chest pain.   warfarin 5 MG tablet Commonly known as: COUMADIN Take 5 mg by mouth See admin instructions. Take 1 tablet (5mg ) by mouth with 1mg  tablet daily on Monday, Wednesday, Thursday, Saturday and Sunday (6mg  total) Take 1 tablets (5mg ) by mouth with 1mg  tablets daily on Tuesday and  Friday (7mg  total)   warfarin 1 MG tablet Commonly known as: COUMADIN Take 1 mg by mouth See admin instructions. Take 1 tablet (1mg ) by mouth with 5mg  tablet daily on Monday, Wednesday, Thursday, Saturday and Sunday (6mg total) Take 2 tablets (2mg) by mouth with 5mg tablet daily on Tuesday and Friday (7mg total)   ZyrTEC Allergy 10 MG tablet Generic drug: cetirizine Take 10 mg by mouth daily.      Allergies  Allergen Reactions  . Procan Sr [Procainamide] Rash    The results of significant diagnostics from this hospitalization (including imaging, microbiology, ancillary and laboratory) are listed below for reference.    Significant Diagnostic Studies: DG Chest 1 View  Result Date: 04/25/2020 CLINICAL DATA:  Shortness of breath for 3 days, fatigue EXAM: CHEST  1 VIEW COMPARISON:  04/23/2020 FINDINGS: Single frontal view of the chest demonstrates an enlarged cardiac silhouette. There is increased vascular congestion with diffuse interstitial prominence. Trace bilateral effusions. No pneumothorax. IMPRESSION: 1. Findings consistent with developing pulmonary edema. Electronically Signed   By: Michael  Brown M.D.   On: 04/25/2020 23:51   DG Chest 2  View  Result Date: 04/23/2020 CLINICAL DATA:  Dyspnea and short of breath EXAM: CHEST - 2 VIEW COMPARISON:  None. FINDINGS: Small right-sided pleural effusion. Cardiomegaly with aortic atherosclerosis. No pneumothorax. IMPRESSION: 1. Mild cardiomegaly with small right-sided pleural effusion. Electronically Signed   By: Kim  Fujinaga M.D.   On: 04/23/2020 23:14   ECHOCARDIOGRAM COMPLETE  Result Date: 04/27/2020    ECHOCARDIOGRAM REPORT   Patient Name:   Mavis T Arment Date of Exam: 04/26/2020 Medical Rec #:  [id number]       Height:       68.0 in Accession #:    [id number]      Weight:       207.0 lb Date of Birth:  12/19/1941        BSA:          2.074 m Patient Age:    79 years        BP:           Not listed in chart/Not listed in                                               chart mmHg Patient Gender: M               HR:           93  bpm. Exam Location:  [facility] Procedure: 2D Echo, Cardiac Doppler and Color Doppler Indications:     CHF- acute diastolic [id number]  History:         Patient has no prior history of Echocardiogram examinations.                  Arrythmias:Atrial Fibrillation; Risk Factors:Hypertension.                  Hearst attack.  Sonographer:     Sherrie Sport RDCS (AE) Referring Phys:  [id number] [place] Diagnosing Phys: Serafina Royals MD IMPRESSIONS  1. Left ventricular ejection fraction, by estimation, is 35 to 40%. The left ventricle has moderately decreased function. The left ventricle demonstrates global hypokinesis. The left ventricular internal cavity size was moderately dilated. Left ventricular diastolic function could not be evaluated.  2. Right ventricular systolic function is  normal. The right ventricular size is normal. There is moderately elevated pulmonary artery systolic pressure.  3. Left atrial size was moderately dilated.  4. The mitral valve is grossly normal. Moderate to severe mitral valve regurgitation.  5. The aortic valve is normal in structure. Aortic valve regurgitation is  trivial. Mild aortic valve stenosis. FINDINGS  Left Ventricle: Left ventricular ejection fraction, by estimation, is 35 to 40%. The left ventricle has moderately decreased function. The left ventricle demonstrates global hypokinesis. The left ventricular internal cavity size was moderately dilated. There is borderline left ventricular hypertrophy. Left ventricular diastolic function could not be evaluated. Right Ventricle: The right ventricular size is normal. No increase in right ventricular wall thickness. Right ventricular systolic function is normal. There is moderately elevated pulmonary artery systolic pressure. The tricuspid regurgitant velocity is 3.09 m/s, and with an assumed right atrial pressure of 10 mmHg, the estimated right ventricular systolic pressure is [id number] mmHg. Left Atrium: Left atrial size was moderately dilated. Right Atrium: Right atrial size was normal in size. Pericardium: There is no evidence of pericardial effusion. Mitral Valve: The mitral valve is grossly normal. Moderate to severe mitral valve regurgitation. Tricuspid Valve: The tricuspid valve is normal in structure. Tricuspid valve regurgitation is mild. Aortic Valve: The aortic valve is normal in structure. Aortic valve regurgitation is trivial. Aortic regurgitation PHT measures 420 msec. Mild aortic stenosis is present. Aortic valve mean gradient measures 11.0 mmHg. Aortic valve peak gradient measures 19.7 mmHg. Aortic valve area, by VTI measures 0.95 cm. Pulmonic Valve: The pulmonic valve was normal in structure. Pulmonic valve regurgitation is trivial. Aorta: The aortic root and ascending aorta are structurally normal, with no evidence of dilitation. IAS/Shunts: No atrial level shunt detected by color flow Doppler.  LEFT VENTRICLE PLAX 2D LVIDd:         6.29 cm LVIDs:         5.27 cm LV PW:         1.26 cm LV IVS:        1.01 cm LVOT diam:     2.00 cm LV SV:         40 LV SV Index:   19 LVOT Area:     3.14 cm  LV Volumes (MOD)  LV vol d, MOD A2C: 185.0 ml LV vol d, MOD A4C: 151.0 ml LV vol s, MOD A2C: 115.0 ml LV vol s, MOD A4C: 70.3 ml LV SV MOD A2C:     70.0 ml LV SV MOD A4C:     151.0 ml LV SV MOD BP:      87.9 ml RIGHT VENTRICLE RV Basal diam:  4.44 cm RV S prime:     12.90 cm/s TAPSE (M-mode): 4.4 cm LEFT ATRIUM              Index       RIGHT ATRIUM           Index LA diam:        5.50 cm  2.65 cm/m  RA Area:     38.10 cm LA Vol (A2C):   134.0 ml 64.61 ml/m RA Volume:   163.00 ml 78.60 ml/m LA Vol (A4C):   88.2 ml  42.53 ml/m LA Biplane Vol: 108.0 ml 52.08 ml/m  AORTIC VALVE                    PULMONIC VALVE AV Area (Vmax):    0.94 cm     PV Vmax:  0.98 m/s AV Area (Vmean):   0.89 cm     PV Peak grad:   3.9 mmHg AV Area (VTI):     0.95 cm     RVOT Peak grad: 3 mmHg AV Vmax:           222.00 cm/s AV Vmean:          154.667 cm/s AV VTI:            0.418 m AV Peak Grad:      19.7 mmHg AV Mean Grad:      11.0 mmHg LVOT Vmax:         66.60 cm/s LVOT Vmean:        44.000 cm/s LVOT VTI:          0.126 m LVOT/AV VTI ratio: 0.30 AI PHT:            420 msec  AORTA Ao Root diam: 3.30 cm MITRAL VALVE                TRICUSPID VALVE MV Area (PHT): 6.17 cm     TR Peak grad:   38.2 mmHg MV Decel Time: 123 msec     TR Vmax:        309.00 cm/s MV E velocity: 129.00 cm/s                             SHUNTS                             Systemic VTI:  0.13 m                             Systemic Diam: 2.00 cm Serafina Royals MD Electronically signed by Serafina Royals MD Signature Date/Time: 04/27/2020/6:04:31 AM    Final     Microbiology: Recent Results (from the past 240 hour(s))  Respiratory Panel by RT PCR (Flu A&B, Covid) - Nasopharyngeal Swab     Status: None   Collection Time: 04/25/20 11:44 PM   Specimen: Nasopharyngeal Swab  Result Value Ref Range Status   SARS Coronavirus 2 by RT PCR NEGATIVE NEGATIVE Final    Comment: (NOTE) SARS-CoV-2 target nucleic acids are NOT DETECTED. The SARS-CoV-2 RNA is generally detectable in  upper respiratoy specimens during the acute phase of infection. The lowest concentration of SARS-CoV-2 viral copies this assay can detect is 131 copies/mL. A negative result does not preclude SARS-Cov-2 infection and should not be used as the sole basis for treatment or other patient management decisions. A negative result may occur with  improper specimen collection/handling, submission of specimen other than nasopharyngeal swab, presence of viral mutation(s) within the areas targeted by this assay, and inadequate number of viral copies (<131 copies/mL). A negative result must be combined with clinical observations, patient history, and epidemiological information. The expected result is Negative. Fact Sheet for Patients:  PinkCheek.be Fact Sheet for Healthcare Providers:  GravelBags.it This test is not yet ap proved or cleared by the [place] FDA and  has been authorized for detection and/or diagnosis of SARS-CoV-2 by FDA under an Emergency Use Authorization (EUA). This EUA will remain  in effect (meaning this test can be used) for the duration of the COVID-19 declaration under Section 564(b)(1) of the Act, 21 U.S.C. section 360bbb-3(b)(1), unless the authorization is terminated or revoked sooner.  Influenza A by PCR NEGATIVE NEGATIVE Final   Influenza B by PCR NEGATIVE NEGATIVE Final    Comment: (NOTE) The Xpert Xpress SARS-CoV-2/FLU/RSV assay is intended as an aid in  the diagnosis of influenza from Nasopharyngeal swab specimens and  should not be used as a sole basis for treatment. Nasal washings and  aspirates are unacceptable for Xpert Xpress SARS-CoV-2/FLU/RSV  testing. Fact Sheet for Patients: PinkCheek.be Fact Sheet for Healthcare Providers: GravelBags.it This test is not yet approved or cleared by the [place] FDA and  has been authorized for  detection and/or diagnosis of SARS-CoV-2 by  FDA under an Emergency Use Authorization (EUA). This EUA will remain  in effect (meaning this test can be used) for the duration of the  Covid-19 declaration under Section 564(b)(1) of the Act, 21  U.S.C. section 360bbb-3(b)(1), unless the authorization is  terminated or revoked. Performed at [facility], [address] [place], [place] [address]      Labs: Basic Metabolic Panel: Recent Labs  Lab 04/25/20 2325 04/27/20 0421 04/27/20 1347 04/28/20 0623 04/29/20 0437  NA 141 142 142 141 141  K 3.9 3.4* 3.8 4.0 3.6  CL 108 106 104 103 102  CO2 22 28 29 29 29   GLUCOSE 160* 123* 132* 122* 106*  BUN 24* 24* 25* 29* 37*  CREATININE 0.79 0.85 0.95 0.88 0.94  CALCIUM 9.3 8.8* 9.0 9.2 9.4  MG 2.1  --  1.9  --  2.1   Liver Function Tests: No results for input(s): AST, ALT, ALKPHOS, BILITOT, PROT, ALBUMIN in the last 168 hours. No results for input(s): LIPASE, AMYLASE in the last 168 hours. No results for input(s): AMMONIA in the last 168 hours. CBC: Recent Labs  Lab 04/25/20 2325 04/27/20 0421 04/28/20 0623 04/29/20 0437  WBC 9.2 8.1 8.3 8.1  HGB 13.4 11.9* 13.1 13.0  HCT 42.8 37.2* 40.0 38.8*  MCV 95.7 93.2 90.9 89.8  PLT 183 175 185 192   Cardiac Enzymes: No results for input(s): CKTOTAL, CKMB, CKMBINDEX, TROPONINI in the last 168 hours. BNP: BNP (last 3 results) Recent Labs    04/25/20 2325  BNP 232.0*   ProBNP (last 3 results) No results for input(s): PROBNP in the last 8760 hours.  CBG: No results for input(s): GLUCAP in the last 168 hours.  Principal Problem:   Acute exacerbation of CHF (congestive heart failure) (HCC) Active Problems:   Acute CHF (congestive heart failure) (HCC)   Uncontrolled hypertension   Dyslipidemia   AF (paroxysmal atrial fibrillation) ([place])   Gout  Time coordinating discharge: 38 minutes.  Signed:        Luretta Everly, DO [facility]  04/29/2020, 4:29 PM

## 2020-04-29 NOTE — Progress Notes (Signed)
[facility] Encounter Note  Patient: John [name] / Admit Date: 04/25/2020 / Date of Encounter: 04/29/2020, 8:28 AM   Subjective: Patient has had some overall improvements of congestive heart failure and shortness of breath.  Patient has still mild pulmonary edema but no apparent significant hypoxia with ambulation.  And patient is now off oxygen supplementation.  Troponin level has been elevated most consistent with demand ischemia rather than acute coronary syndrome.  Telemetry shows atrial fibrillation with more controlled heart rate Echocardiogram showing global LV systolic dysfunction with ejection fraction of 35% with moderate dilation moderate to severe mitral regurgitation and mild aortic stenosis contributing to above  Review of Systems: Positive for: Shortness of breath Negative for: Vision change, hearing change, syncope, dizziness, nausea, vomiting,diarrhea, bloody stool, stomach pain, cough, congestion, diaphoresis, urinary frequency, urinary pain,skin lesions, skin rashes Others previously listed  Objective: Telemetry: Atrial fibrillation with controlled ventricular rate Physical Exam: Blood pressure 130/65, pulse 60, temperature 98.4 F (36.9 C), temperature source Oral, resp. rate 17, height 5\' 8"  (1.727 m), weight 88.9 kg, SpO2 90 %. Body mass index is 29.8 kg/m. General: Well developed, well nourished, in no acute distress. Head: Normocephalic, atraumatic, sclera non-icteric, no xanthomas, nares are without discharge. Neck: No apparent masses Lungs: Normal respirations with no wheezes, no rhonchi, no rales , no crackles   Heart: Irregular rate and rhythm, normal S1 S2, apical and right upper sternal border murmur, no rub, no gallop, PMI is normal size and placement, carotid upstroke normal without bruit, jugular venous pressure normal Abdomen: Soft, non-tender, non-distended with normoactive bowel sounds. No hepatosplenomegaly. Abdominal aorta is  normal size without bruit Extremities: Trace to 1+ edema, no clubbing, no cyanosis, no ulcers,  Peripheral: 2+ radial, 2+ femoral, 2+ dorsal pedal pulses Neuro: Alert and oriented. Moves all extremities spontaneously. Psych:  Responds to questions appropriately with a normal affect.   Intake/Output Summary (Last 24 hours) at 04/29/2020 0828 Last data filed at 04/29/2020 0759 Gross per 24 hour  Intake --  Output 1975 ml  Net -1975 ml    Inpatient Medications:  . amLODipine  10 mg Oral Daily  . atorvastatin  80 mg Oral Daily  . carvedilol  3.125 mg Oral BID WC  . ezetimibe  10 mg Oral Daily  . furosemide  40 mg Intravenous Q12H  . lisinopril  40 mg Oral Daily  . loratadine  10 mg Oral Daily  . pregabalin  150 mg Oral Daily  . sodium chloride flush  3 mL Intravenous Q12H  . warfarin  6 mg Oral ONCE-1600  . Warfarin - Pharmacist Dosing Inpatient   Does not apply q1600   Infusions:  . sodium chloride      Labs: Recent Labs    04/27/20 1347 04/27/20 1347 04/28/20 0623 04/29/20 0437  NA 142   < > 141 141  K 3.8   < > 4.0 3.6  CL 104   < > 103 102  CO2 29   < > 29 29  GLUCOSE 132*   < > 122* 106*  BUN 25*   < > 29* 37*  CREATININE 0.95   < > 0.88 0.94  CALCIUM 9.0   < > 9.2 9.4  MG 1.9  --   --  2.1   < > = values in this interval not displayed.   No results for input(s): AST, ALT, ALKPHOS, BILITOT, PROT, ALBUMIN in the last 72 hours. Recent Labs    04/28/20 [phone] 04/29/20 [phone]  WBC 8.3 8.1  HGB 13.1 13.0  HCT 40.0 38.8*  MCV 90.9 89.8  PLT 185 192   No results for input(s): CKTOTAL, CKMB, TROPONINI in the last 72 hours. Invalid input(s): POCBNP No results for input(s): HGBA1C in the last 72 hours.   Weights: Filed Weights   04/27/20 0449 04/28/20 0355 04/29/20 0509  Weight: 91.1 kg 90.4 kg 88.9 kg     Radiology/Studies:  DG Chest 1 View  Result Date: 04/25/2020 CLINICAL DATA:  Shortness of breath for 3 days, fatigue EXAM: CHEST  1 VIEW COMPARISON:   04/23/2020 FINDINGS: Single frontal view of the chest demonstrates an enlarged cardiac silhouette. There is increased vascular congestion with diffuse interstitial prominence. Trace bilateral effusions. No pneumothorax. IMPRESSION: 1. Findings consistent with developing pulmonary edema. Electronically Signed   By: Randa Ngo M.D.   On: 04/25/2020 23:51   DG Chest 2 View  Result Date: 04/23/2020 CLINICAL DATA:  Dyspnea and short of breath EXAM: CHEST - 2 VIEW COMPARISON:  None. FINDINGS: Small right-sided pleural effusion. Cardiomegaly with aortic atherosclerosis. No pneumothorax. IMPRESSION: 1. Mild cardiomegaly with small right-sided pleural effusion. Electronically Signed   By: Donavan Foil M.D.   On: 04/23/2020 23:14   ECHOCARDIOGRAM COMPLETE  Result Date: 04/27/2020    ECHOCARDIOGRAM REPORT   Patient Name:   John [name] Date of Exam: 04/26/2020 Medical Rec #:  [id number]       Height:       68.0 in Accession #:    [id number]      Weight:       207.0 lb Date of Birth:  [birth year]        BSA:          2.074 m Patient Age:    79 years        BP:           Not listed in chart/Not listed in                                               chart mmHg Patient Gender: M               HR:           93 bpm. Exam Location:  [facility] Procedure: 2D Echo, Cardiac Doppler and Color Doppler Indications:     CHF- acute diastolic [id number]  History:         Patient has no prior history of Echocardiogram examinations.                  Arrythmias:Atrial Fibrillation; Risk Factors:Hypertension.                  Hearst attack.  Sonographer:     Sherrie Sport RDCS (AE) Referring Phys:  [id number] [place] Diagnosing Phys: Serafina Royals MD IMPRESSIONS  1. Left ventricular ejection fraction, by estimation, is 35 to 40%. The left ventricle has moderately decreased function. The left ventricle demonstrates global hypokinesis. The left ventricular internal cavity size was moderately dilated. Left ventricular diastolic function could not be  evaluated.  2. Right ventricular systolic function is normal. The right ventricular size is normal. There is moderately elevated pulmonary artery systolic pressure.  3. Left atrial size was moderately dilated.  4. The mitral valve is grossly normal. Moderate to severe mitral valve regurgitation.  5. The aortic  valve is normal in structure. Aortic valve regurgitation is trivial. Mild aortic valve stenosis. FINDINGS  Left Ventricle: Left ventricular ejection fraction, by estimation, is 35 to 40%. The left ventricle has moderately decreased function. The left ventricle demonstrates global hypokinesis. The left ventricular internal cavity size was moderately dilated. There is borderline left ventricular hypertrophy. Left ventricular diastolic function could not be evaluated. Right Ventricle: The right ventricular size is normal. No increase in right ventricular wall thickness. Right ventricular systolic function is normal. There is moderately elevated pulmonary artery systolic pressure. The tricuspid regurgitant velocity is 3.09 m/s, and with an assumed right atrial pressure of 10 mmHg, the estimated right ventricular systolic pressure is [id number] mmHg. Left Atrium: Left atrial size was moderately dilated. Right Atrium: Right atrial size was normal in size. Pericardium: There is no evidence of pericardial effusion. Mitral Valve: The mitral valve is grossly normal. Moderate to severe mitral valve regurgitation. Tricuspid Valve: The tricuspid valve is normal in structure. Tricuspid valve regurgitation is mild. Aortic Valve: The aortic valve is normal in structure. Aortic valve regurgitation is trivial. Aortic regurgitation PHT measures 420 msec. Mild aortic stenosis is present. Aortic valve mean gradient measures 11.0 mmHg. Aortic valve peak gradient measures 19.7 mmHg. Aortic valve area, by VTI measures 0.95 cm. Pulmonic Valve: The pulmonic valve was normal in structure. Pulmonic valve regurgitation is trivial. Aorta: The  aortic root and ascending aorta are structurally normal, with no evidence of dilitation. IAS/Shunts: No atrial level shunt detected by color flow Doppler.  LEFT VENTRICLE PLAX 2D LVIDd:         6.29 cm LVIDs:         5.27 cm LV PW:         1.26 cm LV IVS:        1.01 cm LVOT diam:     2.00 cm LV SV:         40 LV SV Index:   19 LVOT Area:     3.14 cm  LV Volumes (MOD) LV vol d, MOD A2C: 185.0 ml LV vol d, MOD A4C: 151.0 ml LV vol s, MOD A2C: 115.0 ml LV vol s, MOD A4C: 70.3 ml LV SV MOD A2C:     70.0 ml LV SV MOD A4C:     151.0 ml LV SV MOD BP:      87.9 ml RIGHT VENTRICLE RV Basal diam:  4.44 cm RV S prime:     12.90 cm/s TAPSE (M-mode): 4.4 cm LEFT ATRIUM              Index       RIGHT ATRIUM           Index LA diam:        5.50 cm  2.65 cm/m  RA Area:     38.10 cm LA Vol (A2C):   134.0 ml 64.61 ml/m RA Volume:   163.00 ml 78.60 ml/m LA Vol (A4C):   88.2 ml  42.53 ml/m LA Biplane Vol: 108.0 ml 52.08 ml/m  AORTIC VALVE                    PULMONIC VALVE AV Area (Vmax):    0.94 cm     PV Vmax:        0.98 m/s AV Area (Vmean):   0.89 cm     PV Peak grad:   3.9 mmHg AV Area (VTI):     0.95 cm     RVOT Peak grad: 3  mmHg AV Vmax:           222.00 cm/s AV Vmean:          154.667 cm/s AV VTI:            0.418 m AV Peak Grad:      19.7 mmHg AV Mean Grad:      11.0 mmHg LVOT Vmax:         66.60 cm/s LVOT Vmean:        44.000 cm/s LVOT VTI:          0.126 m LVOT/AV VTI ratio: 0.30 AI PHT:            420 msec  AORTA Ao Root diam: 3.30 cm MITRAL VALVE                TRICUSPID VALVE MV Area (PHT): 6.17 cm     TR Peak grad:   38.2 mmHg MV Decel Time: 123 msec     TR Vmax:        309.00 cm/s MV E velocity: 129.00 cm/s                             SHUNTS                             Systemic VTI:  0.13 m                             Systemic Diam: 2.00 cm Serafina Royals MD Electronically signed by Serafina Royals MD Signature Date/Time: 04/27/2020/6:04:31 AM    Final      Assessment and Recommendation  79 y.o. male with  acute systolic dysfunction congestive heart failure multifactorial in nature including LV systolic dysfunction moderate to severe mitral regurgitation and atrial fibrillation slightly improved at this time after intravenous Lasix without evidence of myocardial infarction but elevated troponin consistent with demand ischemia 1.  Change to oral furosemide for continued reduction of pulmonary edema hypoxia and acute systolic dysfunction heart failure this am and ok for change to oral 40mg  for discharge 2.  Continue medication management for heart rate control including b-blcoker 3.  Continue ACE inhibitor for cardiomyopathy and congestive heart failure 4.  High intensity cholesterol therapy for history of cardiovascular disease 5.  Begin ambulation and follow for improvements of symptoms and further adjustments and ok for dc to home from cardiac standpoint when oxygenation normal with follow-up next week  Signed, Serafina Royals M.D. FACC

## 2020-04-29 NOTE — Progress Notes (Addendum)
SATURATION QUALIFICATIONS: (This note is used to comply with regulatory documentation for home oxygen)  Patient Saturations on Room Air at Rest = 95%  Patient Saturations on Room Air while Ambulating = 95%  Patient Saturations on 0 Liters of oxygen while Ambulating = 95%  Please briefly explain why patient needs home oxygen: not needed    *patient ambulated around nursing station x1 and remained above 95% 02 saturation on room air.  Patient had no complaints of shortness of breath.  MD aware and patient will likely be discharged today.

## 2020-04-29 NOTE — Progress Notes (Signed)
ANTICOAGULATION CONSULT NOTE - Follow up [facility] for warfarin Indication: atrial fibrillation  Allergies  Allergen Reactions  . Procan Sr [Procainamide] Rash    Patient Measurements: Height: 5\' 8"  (172.7 cm) Weight: 88.9 kg (196 lb) IBW/kg (Calculated) : 68.4  Vital Signs: Temp: 98.4 F (36.9 C) (05/11 0736) Temp Source: Oral (05/11 0736) BP: 130/65 (05/11 0736) Pulse Rate: 60 (05/11 0736)  Labs: Recent Labs    04/27/20 0421 04/27/20 0421 04/27/20 1347 04/28/20 0623 04/29/20 0437  HGB 11.9*   < >  --  13.1 13.0  HCT 37.2*  --   --  40.0 38.8*  PLT 175  --   --  185 192  LABPROT 25.6*  --   --  27.6* 25.3*  INR 2.4*  --   --  2.7* 2.4*  CREATININE 0.85   < > 0.95 0.88 0.94   < > = values in this interval not displayed.    Estimated Creatinine Clearance: 66.8 mL/min (by C-G formula based on SCr of 0.94 mg/dL).   Assessment: Patient admitted x SOB w/ h/o CHF, permanent afib s/p ablation on chronic anticoagulation w/ warfarin PTA, w/ SOB s/t CHF exacerbation as evidenced by CXR showing pulmonary edema and cardiomegaly, BNP of 232, w/ elevated BP of [id number] - [id number] systolic. EKG showing RBBB and LPFB, current INR is 2.4 which is therapeutic.   Home Regimen: Warfarin 6mg :  Mon, Wed, Thurs, Sat, Sun                             Warfarin 7mg : Tues, Fri  DATE  INR DOSE 5/7 (PM) 2.4 - 5/8  - 6 mg 5/9                   2.4       6 mg  5/10                 2.7       Dose not given? 5/11  2.4   Goal of Therapy:  INR 2-3 Monitor platelets by anticoagulation protocol: Yes   Plan:  INR therapeutic. Will order warfarin 7 mg PO x 1 at 1600 (home dose).  Will adjust warfarin dose per daily INRs and monitor CBC at least every three days per policy.  Pharmacy will continue to follow.   Rocky Morel, PharmD, BCPS Clinical Pharmacist 04/29/2020 9:10 AM

## 2020-05-05 NOTE — Progress Notes (Signed)
Patient ID: John [name], male    DOB: Aug 08, 1941, 79 y.o.   MRN: [id number]  HPI  John [name] is a 79 y/o male with a history of hyperlipidemia, HTN, MI, atrial fibrillation, former tobacco use and chronic heart failure.   Echo report from 04/26/20 reviewed and showed an EF of 35-40% along with moderately elevated PA pressure and moderate/ severe John.   Admitted 04/25/20 due to acute HF. Cardiology consult obtained. Initially needed IV lasix and then transitioned to oral diuretics. Elevated troponin thought to be due to demand ischemia. Discharged after 4 days.   He presents today for his initial visit with a chief complaint of minimal fatigue upon moderate exertion. He says that this has been present intermittently for many years. He has associated head congestion along with this. He denies any difficulty sleeping, dizziness, abdominal distention, palpitations, pedal edema, chest pain, shortness of breath, cough or fatigue.   Past Medical History:  Diagnosis Date  . A-fib ([place])   . Arrhythmia   . Bowel trouble 1999  . Cardiac disease 1987  . CHF (congestive heart failure) ([place])   . 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 Coronary artery disease   . Heart attack ([place]) 1987  . History of angina 1965  . History of cardioversion   . Hyperlipidemia   . Hypertension 2007   Past Surgical History:  Procedure Laterality Date  . CARDIAC CATHETERIZATION    . COLON RESECTION  1999  . COLON SURGERY    . COLONOSCOPY  05/30/2013   Dr Bary Castilla  . COLONOSCOPY WITH PROPOFOL N/A 08/16/2018   Procedure: COLONOSCOPY WITH PROPOFOL;  Surgeon: Robert Bellow, MD;  Location: [facility] ENDOSCOPY;  Service: Endoscopy;  Laterality: N/A;   Family History  Problem Relation Age of Onset  . Other Father        brain tumor   Social History   Tobacco Use  . Smoking status: Former Smoker    Years: 30.00  . Smokeless tobacco: Never Used  Substance Use Topics  . Alcohol use: Not Currently     Alcohol/week: 7.0 standard drinks    Types: 7 Glasses of wine per week    Comment: 1 glass red wine daily   Allergies  Allergen Reactions  . Procan Sr [Procainamide] Rash   Prior to Admission medications   Medication Sig Start Date End Date Taking? Authorizing Provider  amLODipine (NORVASC) 10 MG tablet Take 10 mg by mouth daily. 03/24/20  Yes [provider]  atorvastatin (LIPITOR) 80 MG tablet Take 80 mg by mouth daily.    Yes [provider]  carvedilol (COREG) 3.125 MG tablet Take 1 tablet (3.125 mg total) by mouth 2 (two) times daily with a meal. 04/29/20  Yes Swayze, Ava, DO  ezetimibe (ZETIA) 10 MG tablet Take 10 mg by mouth daily.    Yes [provider]  furosemide (LASIX) 40 MG tablet Take 1 tablet (40 mg total) by mouth 2 (two) times daily. 04/29/20 04/29/21 Yes Swayze, Ava, DO  lisinopril (ZESTRIL) 40 MG tablet Take 1 tablet (40 mg total) by mouth daily. 04/30/20  Yes Swayze, Ava, DO  LYRICA 150 MG capsule Take 150 mg by mouth daily.  03/09/17  Yes [provider]  montelukast (SINGULAIR) 10 MG tablet Take 10 mg by mouth daily. 04/24/20  Yes [provider]  warfarin (COUMADIN) 1 MG tablet Take 1 mg by mouth See admin instructions. Take 1 tablet (1mg ) by mouth with 5mg   tablet daily on Monday, Wednesday, Thursday, Saturday and Sunday (6mg  total) Take 2 tablets (2mg ) by mouth with 5mg  tablet daily on Tuesday and Friday (7mg  total)   Yes [provider]  warfarin (COUMADIN) 5 MG tablet Take 5 mg by mouth See admin instructions. Take 1 tablet (5mg ) by mouth with 1mg  tablet daily on Monday, Wednesday, Thursday, Saturday and Sunday (6mg  total) Take 1 tablets (5mg ) by mouth with 1mg  tablets daily on Tuesday and Friday (7mg  total)   Yes [provider]       Review of Systems  Constitutional: Positive for fatigue (minimal). Negative for appetite change.  HENT: Positive for congestion. Negative for postnasal drip and sore throat.    Eyes: Negative.   Respiratory: Negative for cough and shortness of breath.   Cardiovascular: Negative for chest pain and palpitations.  Gastrointestinal: Negative for abdominal distention and abdominal pain.  Endocrine: Negative.   Genitourinary: Negative.   Musculoskeletal: Negative for back pain and neck pain.  Skin: Negative.   Allergic/Immunologic: Negative.   Neurological: Negative for dizziness and light-headedness.  Hematological: Negative for adenopathy. Does not bruise/bleed easily.  Psychiatric/Behavioral: Negative for dysphoric mood and sleep disturbance (sleeping on 2 pillows). The patient is not nervous/anxious.     Vitals:   05/06/20 1225  BP: (!) 119/48  Pulse: (!) 59  Resp: 20  SpO2: 99%  Weight: 201 lb 8 oz (91.4 kg)  Height: 5\' 8"  (1.727 m)   Wt Readings from Last 3 Encounters:  05/06/20 201 lb 8 oz (91.4 kg)  04/29/20 196 lb (88.9 kg)  08/16/18 225 lb (102.1 kg)   Lab Results  Component Value Date   CREATININE 0.94 04/29/2020   CREATININE 0.88 04/28/2020   CREATININE 0.95 04/27/2020     Physical Exam Vitals and nursing note reviewed.  Constitutional:      Appearance: Normal appearance.  HENT:     Head: Normocephalic and atraumatic.  Cardiovascular:     Rate and Rhythm: Regular rhythm. Bradycardia present.  Pulmonary:     Effort: Pulmonary effort is normal. No respiratory distress.     Breath sounds: No wheezing or rales.  Abdominal:     General: There is no distension.     Palpations: Abdomen is soft.     Tenderness: There is no abdominal tenderness.  Musculoskeletal:        General: No tenderness.     Cervical back: Normal range of motion and neck supple.     Right lower leg: No edema.     Left lower leg: No edema.  Skin:    General: Skin is warm and dry.  Neurological:     General: No focal deficit present.     Mental Status: He is alert and oriented to person, place, and time.  Psychiatric:        Mood and Affect: Mood normal.         Behavior: Behavior normal.        Thought Content: Thought content normal.    Assessment & Plan:  1: Chronic heart failure with reduced ejection fraction- - NYHA class II - euvolemic today - already weighing daily; instructed to call for an overnight weight gain of >2 pounds or a weekly weight gain of >5 pounds - not adding salt to his food but does say that he and his wife eat out for lunch "often"; reviewed the importance of closely following a low sodium diet and written information along with a low sodium cookbook were given  to him - saw cardiology ([place]) 02/14/20 & returns tomorrow - consider changing lisinopril to entresto and this was discussed with him but he would prefer Dr. Ubaldo Glassing to adjust any medications - unable to titrate carvedilol due to bradycardia - BNP 04/25/20 was 232.0  - PharmD reconciled medications with the patient  2: HTN- - BP looks good; slightly on the low side today - saw PCP Iona Beard) 04/23/20 - BMP from 04/29/20 reviewed and showed sodium 141, potassium 3.6, creatinine 0.94 and GFR >60  3: Atrial fibrillation- - atrial ablation done in the past - on warfarin   Medication list was reviewed.   Patient opts to not make a return appointment at this time. Advised him that he could return at anytime to schedule another appointment.

## 2020-05-06 ENCOUNTER — Ambulatory Visit: Payer: Medicare Other | Attending: Family | Admitting: Family

## 2020-05-06 ENCOUNTER — Other Ambulatory Visit: Payer: Self-pay

## 2020-05-06 ENCOUNTER — Telehealth: Payer: Self-pay | Admitting: Family

## 2020-05-06 ENCOUNTER — Encounter: Payer: Self-pay | Admitting: Family

## 2020-05-06 VITALS — BP 119/48 | HR 59 | Resp 20 | Ht 68.0 in | Wt 201.5 lb

## 2020-05-06 DIAGNOSIS — R001 Bradycardia, unspecified: Secondary | ICD-10-CM | POA: Insufficient documentation

## 2020-05-06 DIAGNOSIS — I4891 Unspecified atrial fibrillation: Secondary | ICD-10-CM | POA: Diagnosis not present

## 2020-05-06 DIAGNOSIS — I110 Hypertensive heart disease with heart failure: Secondary | ICD-10-CM | POA: Insufficient documentation

## 2020-05-06 DIAGNOSIS — I2510 Atherosclerotic heart disease of native coronary artery without angina pectoris: Secondary | ICD-10-CM | POA: Insufficient documentation

## 2020-05-06 DIAGNOSIS — I252 Old myocardial infarction: Secondary | ICD-10-CM | POA: Diagnosis not present

## 2020-05-06 DIAGNOSIS — Z888 Allergy status to other drugs, medicaments and biological substances status: Secondary | ICD-10-CM | POA: Insufficient documentation

## 2020-05-06 DIAGNOSIS — E785 Hyperlipidemia, unspecified: Secondary | ICD-10-CM | POA: Diagnosis not present

## 2020-05-06 DIAGNOSIS — I10 Essential (primary) hypertension: Secondary | ICD-10-CM

## 2020-05-06 DIAGNOSIS — Z87891 Personal history of nicotine dependence: Secondary | ICD-10-CM | POA: Insufficient documentation

## 2020-05-06 DIAGNOSIS — Z79899 Other long term (current) drug therapy: Secondary | ICD-10-CM | POA: Insufficient documentation

## 2020-05-06 DIAGNOSIS — I5022 Chronic systolic (congestive) heart failure: Secondary | ICD-10-CM | POA: Insufficient documentation

## 2020-05-06 DIAGNOSIS — Z7901 Long term (current) use of anticoagulants: Secondary | ICD-10-CM | POA: Diagnosis not present

## 2020-05-06 DIAGNOSIS — Z8719 Personal history of other diseases of the digestive system: Secondary | ICD-10-CM | POA: Insufficient documentation

## 2020-05-06 DIAGNOSIS — I480 Paroxysmal atrial fibrillation: Secondary | ICD-10-CM

## 2020-05-06 NOTE — Patient Instructions (Addendum)
Continue weighing daily and call for an overnight weight gain of > 2 pounds or a weekly weight gain of >5 pounds.   Call us in the future if you'd like to schedule another appointment 

## 2020-05-06 NOTE — Telephone Encounter (Signed)
Unable to reach patient 5/17 or 5/18 regarding his new patient CHF Clinic appointment.    Alyse Low, [place]

## 2020-05-06 NOTE — Progress Notes (Signed)
[place] - PHARMACIST COUNSELING NOTE  ADHERENCE ASSESSMENT  Adherence strategy: Patient takes medications from pillbox.    Do you ever forget to take your medication? [] Yes (1) [x] No (0)  Do you ever skip doses due to side effects? [] Yes (1) [x] No (0)  Do you have trouble affording your medicines? [] Yes (1) [x] No (0)  Are you ever unable to pick up your medication due to transportation difficulties? [] Yes (1) [x] No (0)  Do you ever stop taking your medications because you don't believe they are helping? [] Yes (1) [x] No (0)  Total score 0   Recommendations given to patient about increasing adherence: None needed  Guideline-Directed Medical Therapy/Evidence Based Medicine  ACE/ARB/ARNI: Lisinopril 40 mg daily Beta Blocker: Carvedilol 3.125 mg BID Aldosterone Antagonist: None Diuretic: Furosemide 40 mg BID    SUBJECTIVE  HPI: Patient is an 79 y/o M with PMH as below who presents to CHF clinic as new patient. Patient was hospitalized 5/7 - 5/11 with CHF exacerbation. Digoxin was discontinued during this hospitalization and replaced by carvedilol.   Past Medical History:  Diagnosis Date  . A-fib ([address])   . Bowel trouble 1999  . Cardiac disease 1987  . CHF (congestive heart failure) ([place])   . 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 Heart attack ([place]) 1987  . History of angina 1965  . History of cardioversion   . Hyperlipidemia   . Hypertension 2007      OBJECTIVE   Vital signs: HR 59, BP 119/48, weight (pounds) 201 ECHO: Date 04/26/2020, EF 35-40%, notes: LV global hypokinesis  BMP Latest Ref Rng & Units 04/29/2020 04/28/2020 04/27/2020  Glucose 70 - 99 mg/dL 106(H) 122(H) 132(H)  BUN 8 - 23 mg/dL 37(H) 29(H) 25(H)  Creatinine 0.61 - 1.24 mg/dL 0.94 0.88 0.95  Sodium 135 - 145 mmol/L 141 141 142  Potassium 3.5 - 5.1 mmol/L 3.6 4.0 3.8  Chloride 98 - 111 mmol/L 102 103 104  CO2 22 - 32 mmol/L 29 29 29   Calcium 8.9 -  10.3 mg/dL 9.4 9.2 9.0    ASSESSMENT  Patient is well appearing and in no acute distress. He endorses taking medications daily as prescribed. Denies adverse effects of therapy or missed doses.  PLAN  1). CHF (EF 35-40%) -GDMT with: lisinopril 40 mg daily, carvedilol 3.125 mg BID -Fluid management with furosemide 40 mg BID -Further titration of medications limited by soft BP -Discussed with provider - no changes today  2). Hypertension -Diastolic pressures on lower side in clinic today. Patient is asymptomatic -Antihypertensive regimen includes amlodipine, carvedilol, lisinopril, furosemide -Denies signs/symptoms of hypotension -Future consideration to d/c amlodipine to allow blood pressure room for medications with benefit in HF  3). Atrial fibrillation -Digoxin previously discontinued. Rate control with carvedilol -Warfarin 7 mg TuFr and 6 mg EOD (goal INR 2-3). Last INR 2.4 on 04/29/20 -Denies signs/symptoms of bleeding  4). Hyperlipidemia -Atorvastatin 80 mg daily + ezetimibe 10 mg daily -03/21/20 lipid panel with TG 72, LDL 42, HDL 31 -Denies myalgias / leg weakness  5). Neuropathic pain -Lyrica 150 mg daily  6). Allergies -Montelukast 10 mg daily  Time spent: 15 minutes  [place] Resident 05/06/2020 12:31 PM    Current Outpatient Medications:  .  amLODipine (NORVASC) 10 MG tablet, Take 10 mg by mouth daily., Disp: , Rfl:  .  atorvastatin (LIPITOR) 80 MG tablet, Take 80 mg by mouth daily. , Disp: , Rfl: 3 .  carvedilol (COREG) 3.125 MG tablet, Take 1 tablet (3.125 mg total) by mouth 2 (two) times daily with a meal., Disp: 60 tablet, Rfl: 0 .  cetirizine (ZYRTEC ALLERGY) 10 MG tablet, Take 10 mg by mouth daily. , Disp: , Rfl:  .  ezetimibe (ZETIA) 10 MG tablet, Take 10 mg by mouth daily. , Disp: , Rfl:  .  furosemide (LASIX) 40 MG tablet, Take 1 tablet (40 mg total) by mouth 2 (two) times daily., Disp: 60 tablet, Rfl: 11 .  lisinopril (ZESTRIL) 40 MG  tablet, Take 1 tablet (40 mg total) by mouth daily., Disp:  , Rfl:  .  LYRICA 150 MG capsule, Take 150 mg by mouth daily. , Disp: , Rfl:  .  montelukast (SINGULAIR) 10 MG tablet, Take 10 mg by mouth daily., Disp: , Rfl:  .  nitroGLYCERIN (NITROSTAT) 0.4 MG SL tablet, Place 0.4 mg under the tongue every 5 (five) minutes as needed for chest pain., Disp: , Rfl:  .  warfarin (COUMADIN) 1 MG tablet, Take 1 mg by mouth See admin instructions. Take 1 tablet (1mg ) by mouth with 5mg  tablet daily on Monday, Wednesday, Thursday, Saturday and Sunday (6mg  total) Take 2 tablets (2mg ) by mouth with 5mg  tablet daily on Tuesday and Friday (7mg  total), Disp: , Rfl:  .  warfarin (COUMADIN) 5 MG tablet, Take 5 mg by mouth See admin instructions. Take 1 tablet (5mg ) by mouth with 1mg  tablet daily on Monday, Wednesday, Thursday, Saturday and Sunday (6mg  total) Take 1 tablets (5mg ) by mouth with 1mg  tablets daily on Tuesday and Friday (7mg  total), Disp: , Rfl:    COUNSELING POINTS/CLINICAL PEARLS  Carvedilol (Goal: weight less than 85 kg is 25 mg BID, weight greater than 85 kg is 50 mg BID)  Patient should avoid activities requiring coordination until drug effects are realized, as drug may cause dizziness.  This drug may cause diarrhea, nausea, vomiting, arthralgia, back pain, myalgia, headache, vision disorder, erectile dysfunction, reduced libido, or fatigue.  Instruct patient to report signs/symptoms of adverse cardiovascular effects such as hypotension (especially in elderly patients), arrhythmias, syncope, palpitations, angina, or edema.  Drug may mask symptoms of hypoglycemia. Advise diabetic patients to carefully monitor blood sugar levels.  Patient should take drug with food.  Advise patient against sudden discontinuation of drug. Lisinopril (Goal: 20 to 40 mg once daily)  This drug may cause nausea, vomiting, dizziness, headache, or angioedema of face, lips, throat, or intestines.  Instruct patient to report  signs/symptoms of hypotension, or a persistent cough.  Advise patient against sudden discontinuation of drug. Furosemide  Drug causes sun-sensitivity. Advise patient to use sunscreen and avoid tanning beds. Patient should avoid activities requiring coordination until drug effects are realized, as drug may cause dizziness, vertigo, or blurred vision. This drug may cause hyperglycemia, hyperuricemia, constipation, diarrhea, loss of appetite, nausea, vomiting, purpuric disorder, cramps, spasticity, asthenia, headache, paresthesia, or scaling eczema. Instruct patient to report unusual bleeding/bruising or signs/symptoms of hypotension, infection, pancreatitis, or ototoxicity (tinnitus, hearing impairment). Advise patient to report signs/symptoms of a severe skin reactions (flu-like symptoms, spreading red rash, or skin/mucous membrane blistering) or erythema multiforme. Instruct patient to eat high-potassium foods during drug therapy, as directed by healthcare professional.  Patient should not drink alcohol while taking this drug.  DRUGS TO AVOID IN HEART FAILURE  Drug or Class Mechanism  Analgesics . NSAIDs . COX-2 inhibitors . Glucocorticoids  Sodium and water retention, increased systemic vascular resistance, decreased response to diuretics   Diabetes Medications .  Metformin . Thiazolidinediones o Rosiglitazone (Avandia) o Pioglitazone (Actos) . DPP4 Inhibitors o Saxagliptin (Onglyza) o Sitagliptin (Januvia)   Lactic acidosis Possible calcium channel blockade   Unknown  Antiarrhythmics . Class I  o Flecainide o Disopyramide . Class III o Sotalol . Other o Dronedarone  Negative inotrope, proarrhythmic   Proarrhythmic, beta blockade  Negative inotrope  Antihypertensives . Alpha Blockers o Doxazosin . Calcium Channel Blockers o Diltiazem o Verapamil o Nifedipine . Central Alpha Adrenergics o Moxonidine . Peripheral Vasodilators o Minoxidil  Increases renin and  aldosterone  Negative inotrope    Possible sympathetic withdrawal  Unknown  Anti-infective . Itraconazole . Amphotericin B  Negative inotrope Unknown  Hematologic . Anagrelide . Cilostazol   Possible inhibition of PD IV Inhibition of PD III causing arrhythmias  Neurologic/Psychiatric . Stimulants . Anti-Seizure Drugs o Carbamazepine o Pregabalin . Antidepressants o Tricyclics o Citalopram . Parkinsons o Bromocriptine o Pergolide o Pramipexole . Antipsychotics o Clozapine . Antimigraine o Ergotamine o Methysergide . Appetite suppressants . Bipolar o Lithium  Peripheral alpha and beta agonist activity  Negative inotrope and chronotrope Calcium channel blockade  Negative inotrope, proarrhythmic Dose-dependent QT prolongation  Excessive serotonin activity/valvular damage Excessive serotonin activity/valvular damage Unknown  IgE mediated hypersensitivy, calcium channel blockade  Excessive serotonin activity/valvular damage Excessive serotonin activity/valvular damage Valvular damage  Direct myofibrillar degeneration, adrenergic stimulation  Antimalarials . Chloroquine . Hydroxychloroquine Intracellular inhibition of lysosomal enzymes  Urologic Agents . Alpha Blockers o Doxazosin o Prazosin o Tamsulosin o Terazosin  Increased renin and aldosterone  Adapted from Page RL, et al. "Drugs That May Cause or Exacerbate Heart Failure: A Scientific Statement from the [employer]." Circulation 2016; [id number]. DOI: 10.1161/CIR.0000000000000426   MEDICATION ADHERENCES TIPS AND STRATEGIES 1. Taking medication as prescribed improves patient outcomes in heart failure (reduces hospitalizations, improves symptoms, increases survival) 2. Side effects of medications can be managed by decreasing doses, switching agents, stopping drugs, or adding additional therapy. Please let someone in the [facility] know if you have having bothersome side  effects so we can modify your regimen. Do not alter your medication regimen without talking to [place].  3. Medication reminders can help patients remember to take drugs on time. If you are missing or forgetting doses you can try linking behaviors, using pill boxes, or an electronic reminder like an alarm on your phone or an app. Some people can also get automated phone calls as medication reminders.

## 2020-05-07 ENCOUNTER — Other Ambulatory Visit
Admission: RE | Admit: 2020-05-07 | Discharge: 2020-05-07 | Disposition: A | Discharge status: Home / Self Care | Payer: Medicare Other | Source: Ambulatory Visit | Attending: Cardiology | Admitting: Cardiology

## 2020-05-07 DIAGNOSIS — I5022 Chronic systolic (congestive) heart failure: Secondary | ICD-10-CM | POA: Diagnosis present

## 2020-05-07 LAB — BRAIN NATRIURETIC PEPTIDE: B Natriuretic Peptide: 118 pg/mL — ABNORMAL HIGH (ref 0.0–100.0)

## 2020-05-20 DEATH — deceased

## 2020-12-03 IMAGING — DX DG CHEST 1V
1 series · 1 of 1 positions shown · non-contrast
Comparison: 04/23/2020

CLINICAL DATA: Shortness of breath for 3 days, fatigue

EXAM:
CHEST  1 VIEW

[chest ap]
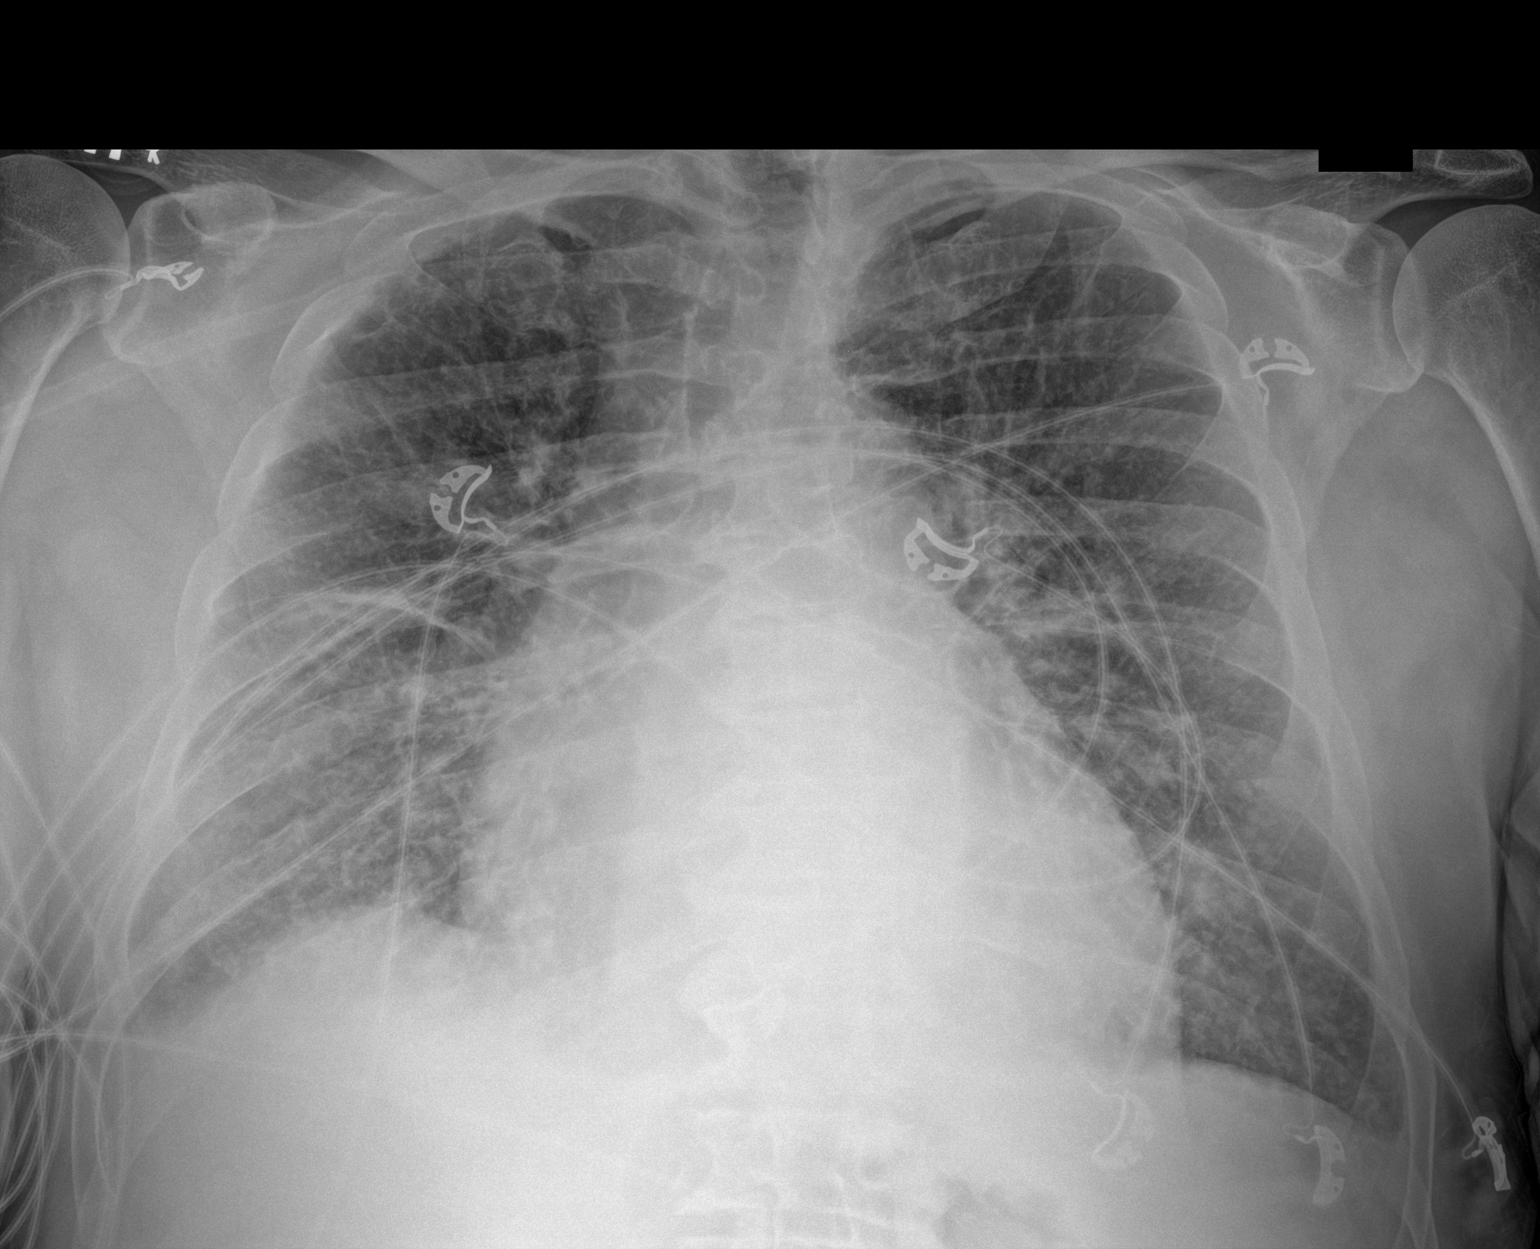

[1 of 1 positions shown; findings below may reference images not displayed]

FINDINGS: Single frontal view of the chest demonstrates an enlarged cardiac
silhouette. There is increased vascular congestion with diffuse
interstitial prominence. Trace bilateral effusions. No pneumothorax.
IMPRESSION: 1. Findings consistent with developing pulmonary edema.
